# Patient Record
Sex: Female | Born: 1940 | Race: White | Hispanic: No | Marital: Married | Smoking: Never smoker
Health system: Southern US, Community
[De-identification: ages and names within clinical notes are randomized; demographics above are authoritative.]

## PROBLEM LIST (undated history)

## (undated) DIAGNOSIS — I10 Essential (primary) hypertension: Secondary | ICD-10-CM

## (undated) DIAGNOSIS — C801 Malignant (primary) neoplasm, unspecified: Secondary | ICD-10-CM

## (undated) DIAGNOSIS — L509 Urticaria, unspecified: Secondary | ICD-10-CM

## (undated) DIAGNOSIS — E039 Hypothyroidism, unspecified: Secondary | ICD-10-CM

## (undated) DIAGNOSIS — K5790 Diverticulosis of intestine, part unspecified, without perforation or abscess without bleeding: Secondary | ICD-10-CM

## (undated) DIAGNOSIS — E785 Hyperlipidemia, unspecified: Secondary | ICD-10-CM

## (undated) DIAGNOSIS — K589 Irritable bowel syndrome without diarrhea: Secondary | ICD-10-CM

## (undated) DIAGNOSIS — F419 Anxiety disorder, unspecified: Secondary | ICD-10-CM

## (undated) HISTORY — DX: Irritable bowel syndrome, unspecified: K58.9

## (undated) HISTORY — PX: TONSILLECTOMY: SUR1361

## (undated) HISTORY — DX: Malignant (primary) neoplasm, unspecified: C80.1

## (undated) HISTORY — PX: THYROIDECTOMY, PARTIAL: SHX18

## (undated) HISTORY — DX: Hyperlipidemia, unspecified: E78.5

## (undated) HISTORY — DX: Essential (primary) hypertension: I10

## (undated) HISTORY — DX: Anxiety disorder, unspecified: F41.9

## (undated) HISTORY — DX: Diverticulosis of intestine, part unspecified, without perforation or abscess without bleeding: K57.90

## (undated) HISTORY — PX: CHOLECYSTECTOMY: SHX55

## (undated) HISTORY — PX: APPENDECTOMY: SHX54

## (undated) HISTORY — PX: OTHER SURGICAL HISTORY: SHX169

## (undated) HISTORY — DX: Hypothyroidism, unspecified: E03.9

## (undated) HISTORY — DX: Urticaria, unspecified: L50.9

---

## 1998-01-13 ENCOUNTER — Other Ambulatory Visit: Admission: RE | Admit: 1998-01-13 | Discharge: 1998-01-13 | Payer: Self-pay | Admitting: Obstetrics and Gynecology

## 1998-06-10 ENCOUNTER — Other Ambulatory Visit: Admission: RE | Admit: 1998-06-10 | Discharge: 1998-06-10 | Payer: Self-pay | Admitting: Otolaryngology

## 1999-07-06 ENCOUNTER — Other Ambulatory Visit: Admission: RE | Admit: 1999-07-06 | Discharge: 1999-07-06 | Payer: Self-pay | Admitting: Obstetrics and Gynecology

## 1999-11-21 ENCOUNTER — Encounter: Admission: RE | Admit: 1999-11-21 | Discharge: 1999-11-21 | Payer: Self-pay | Admitting: *Deleted

## 1999-12-01 ENCOUNTER — Encounter (INDEPENDENT_AMBULATORY_CARE_PROVIDER_SITE_OTHER): Payer: Self-pay | Admitting: *Deleted

## 1999-12-01 ENCOUNTER — Ambulatory Visit (HOSPITAL_COMMUNITY): Admission: RE | Admit: 1999-12-01 | Discharge: 1999-12-01 | Payer: Self-pay | Admitting: *Deleted

## 2000-03-20 ENCOUNTER — Encounter: Payer: Self-pay | Admitting: Surgery

## 2000-03-20 ENCOUNTER — Encounter: Admission: RE | Admit: 2000-03-20 | Discharge: 2000-03-20 | Payer: Self-pay | Admitting: Surgery

## 2000-03-22 ENCOUNTER — Encounter: Payer: Self-pay | Admitting: Surgery

## 2000-03-26 ENCOUNTER — Observation Stay (HOSPITAL_COMMUNITY): Admission: RE | Admit: 2000-03-26 | Discharge: 2000-03-27 | Payer: Self-pay | Admitting: Surgery

## 2000-03-26 ENCOUNTER — Encounter (INDEPENDENT_AMBULATORY_CARE_PROVIDER_SITE_OTHER): Payer: Self-pay

## 2000-10-11 ENCOUNTER — Other Ambulatory Visit: Admission: RE | Admit: 2000-10-11 | Discharge: 2000-10-11 | Payer: Self-pay | Admitting: Obstetrics and Gynecology

## 2002-04-07 ENCOUNTER — Other Ambulatory Visit: Admission: RE | Admit: 2002-04-07 | Discharge: 2002-04-07 | Payer: Self-pay | Admitting: Obstetrics and Gynecology

## 2003-05-24 ENCOUNTER — Other Ambulatory Visit: Admission: RE | Admit: 2003-05-24 | Discharge: 2003-05-24 | Payer: Self-pay | Admitting: Radiology

## 2003-08-28 HISTORY — PX: OTHER SURGICAL HISTORY: SHX169

## 2003-11-05 ENCOUNTER — Other Ambulatory Visit: Admission: RE | Admit: 2003-11-05 | Discharge: 2003-11-05 | Payer: Self-pay | Admitting: Obstetrics and Gynecology

## 2004-03-14 ENCOUNTER — Encounter: Admission: RE | Admit: 2004-03-14 | Discharge: 2004-03-14 | Payer: Self-pay | Admitting: Surgery

## 2004-03-15 ENCOUNTER — Ambulatory Visit (HOSPITAL_BASED_OUTPATIENT_CLINIC_OR_DEPARTMENT_OTHER): Admission: RE | Admit: 2004-03-15 | Discharge: 2004-03-15 | Payer: Self-pay | Admitting: Surgery

## 2004-03-15 ENCOUNTER — Ambulatory Visit (HOSPITAL_COMMUNITY): Admission: RE | Admit: 2004-03-15 | Discharge: 2004-03-15 | Payer: Self-pay | Admitting: Surgery

## 2004-03-15 ENCOUNTER — Encounter (INDEPENDENT_AMBULATORY_CARE_PROVIDER_SITE_OTHER): Payer: Self-pay | Admitting: *Deleted

## 2004-04-04 ENCOUNTER — Ambulatory Visit: Admission: RE | Admit: 2004-04-04 | Discharge: 2004-06-08 | Payer: Self-pay | Admitting: Radiation Oncology

## 2004-07-10 ENCOUNTER — Encounter: Admission: RE | Admit: 2004-07-10 | Discharge: 2004-07-10 | Payer: Self-pay | Admitting: Surgery

## 2005-11-13 IMAGING — CR DG CHEST 2V
2 series · 2 of 2 positions shown · non-contrast
Comparison: none

CLINICAL DATA: Right breast carcinoma.  Preop respiratory exam.
 CHEST X-RAY
 Two views of the chest are compared to a chest x-ray of 10/01/95.  The lungs are clear.  The heart is within normal limits in size.  Surgical clips overlie the right upper outer breast. 
 IMPRESSION
 No active lung disease.

[view not recorded (1 of 2)]
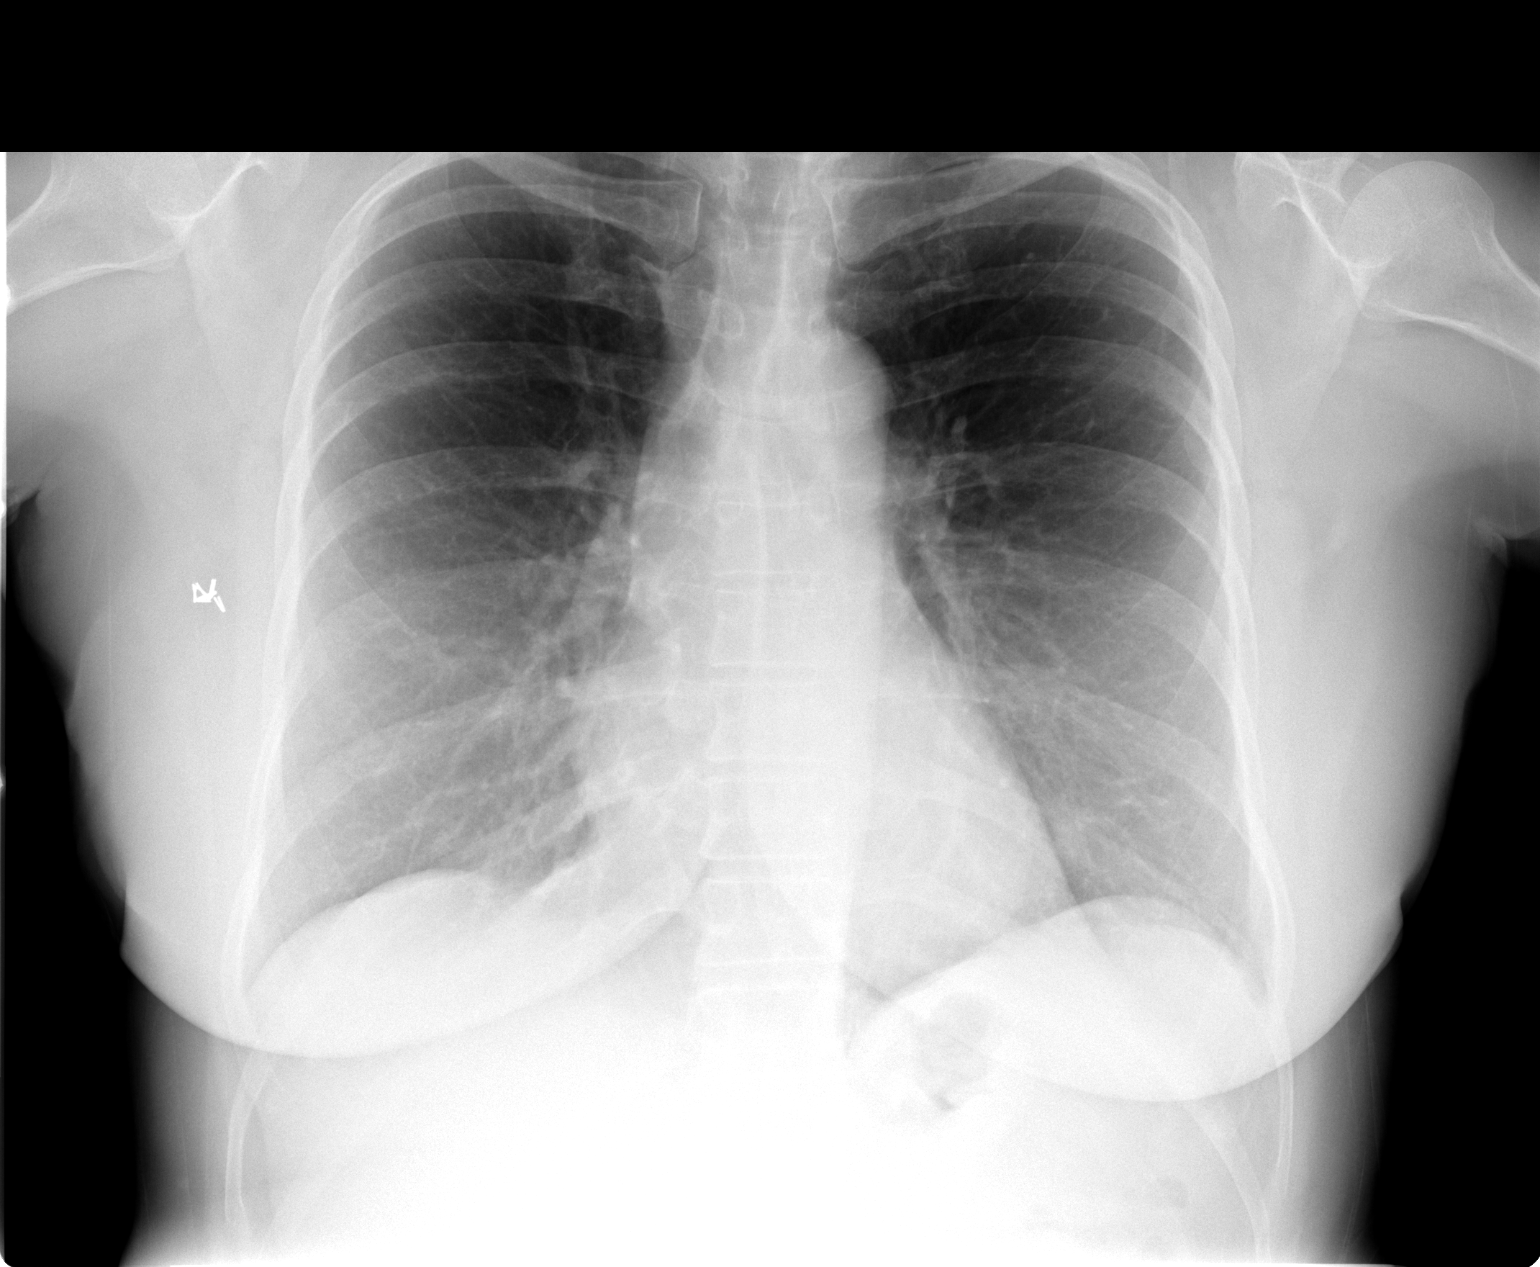

[view not recorded (2 of 2)]
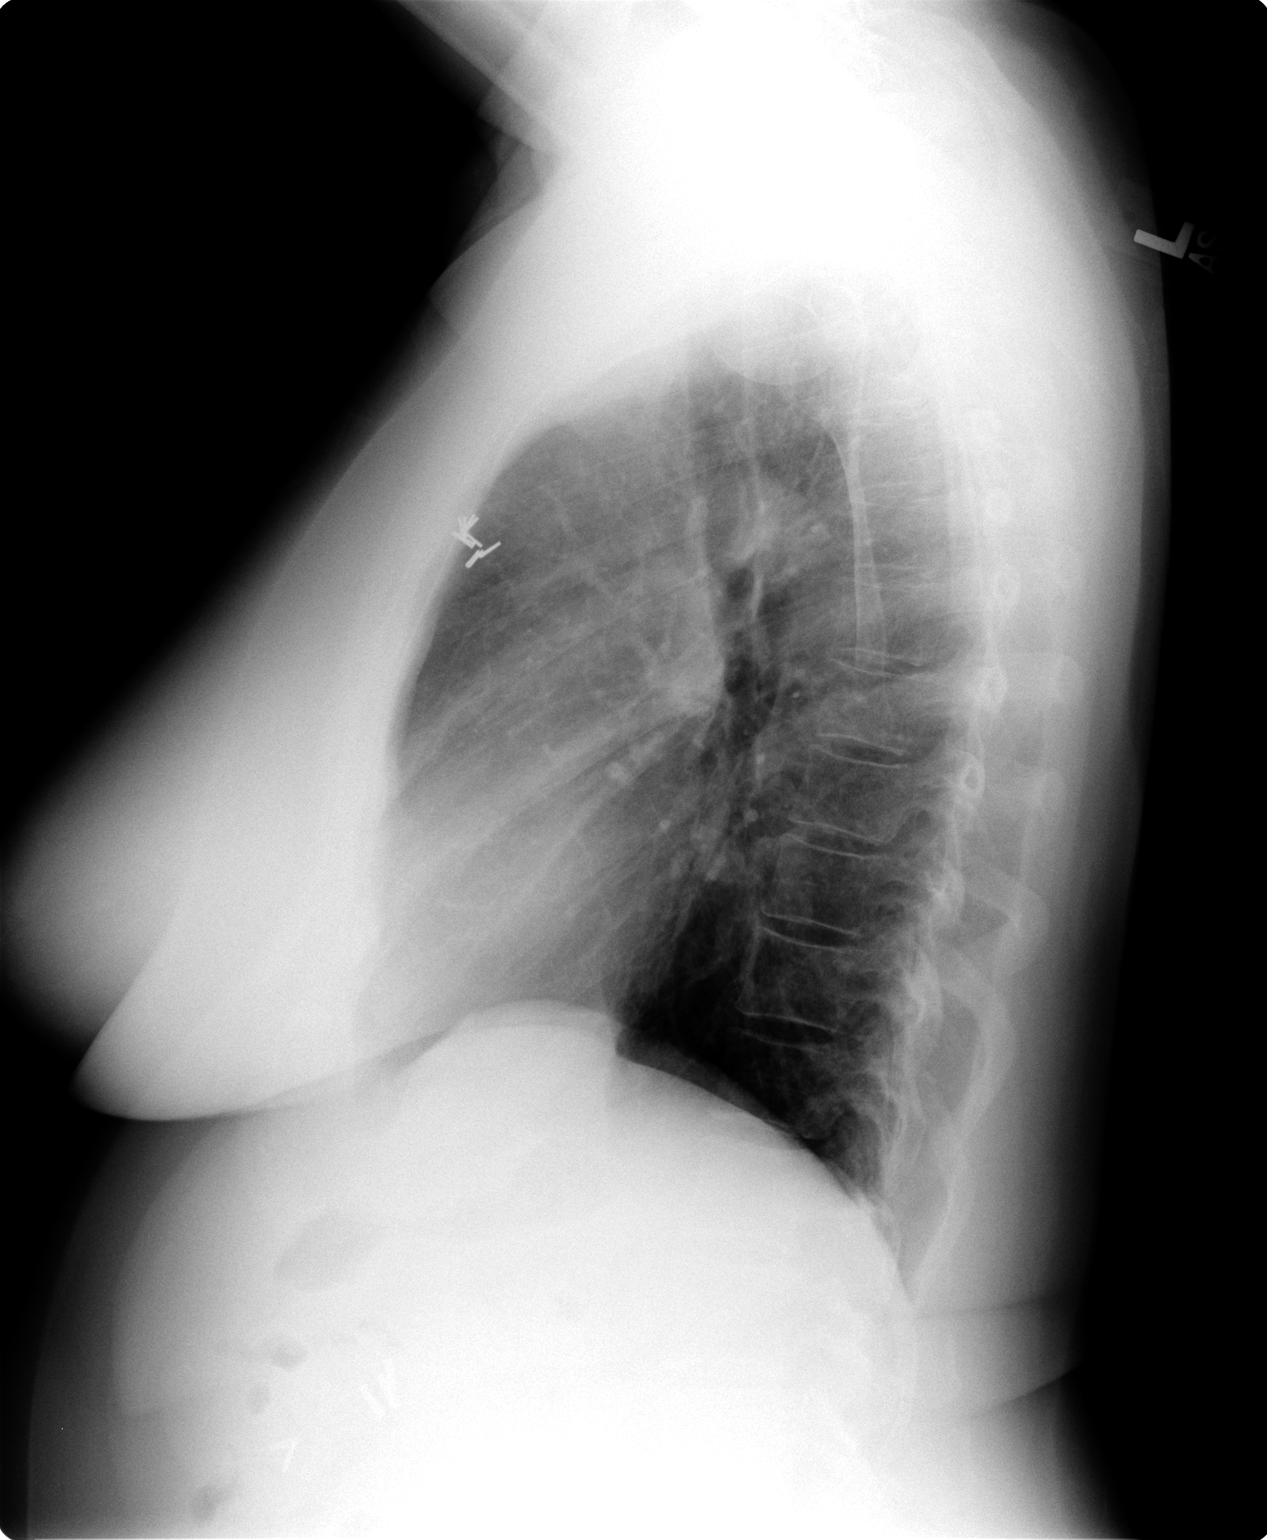

[2 of 2 positions shown; findings below may reference images not displayed]

## 2006-01-03 ENCOUNTER — Other Ambulatory Visit: Admission: RE | Admit: 2006-01-03 | Discharge: 2006-01-03 | Payer: Self-pay | Admitting: Obstetrics and Gynecology

## 2009-04-14 ENCOUNTER — Inpatient Hospital Stay (HOSPITAL_COMMUNITY): Admission: EM | Admit: 2009-04-14 | Discharge: 2009-04-15 | Payer: Self-pay | Admitting: Emergency Medicine

## 2009-04-14 ENCOUNTER — Ambulatory Visit: Payer: Self-pay | Admitting: Diagnostic Radiology

## 2009-04-14 ENCOUNTER — Encounter: Payer: Self-pay | Admitting: Emergency Medicine

## 2010-02-06 ENCOUNTER — Ambulatory Visit: Payer: Self-pay | Admitting: Emergency Medicine

## 2010-02-06 DIAGNOSIS — I10 Essential (primary) hypertension: Secondary | ICD-10-CM | POA: Insufficient documentation

## 2010-02-06 DIAGNOSIS — J45909 Unspecified asthma, uncomplicated: Secondary | ICD-10-CM | POA: Insufficient documentation

## 2010-02-06 DIAGNOSIS — S7010XA Contusion of unspecified thigh, initial encounter: Secondary | ICD-10-CM | POA: Insufficient documentation

## 2010-04-15 ENCOUNTER — Encounter: Payer: Self-pay | Admitting: Cardiology

## 2010-05-09 DIAGNOSIS — K589 Irritable bowel syndrome without diarrhea: Secondary | ICD-10-CM | POA: Insufficient documentation

## 2010-05-09 DIAGNOSIS — F411 Generalized anxiety disorder: Secondary | ICD-10-CM | POA: Insufficient documentation

## 2010-05-10 ENCOUNTER — Ambulatory Visit: Payer: Self-pay | Admitting: Cardiology

## 2010-05-10 ENCOUNTER — Encounter: Payer: Self-pay | Admitting: Cardiology

## 2010-05-10 DIAGNOSIS — R55 Syncope and collapse: Secondary | ICD-10-CM | POA: Insufficient documentation

## 2010-05-10 DIAGNOSIS — R072 Precordial pain: Secondary | ICD-10-CM | POA: Insufficient documentation

## 2010-05-15 ENCOUNTER — Telehealth: Payer: Self-pay | Admitting: Cardiology

## 2010-05-22 ENCOUNTER — Ambulatory Visit: Payer: Self-pay

## 2010-05-22 ENCOUNTER — Ambulatory Visit: Payer: Self-pay | Admitting: Cardiology

## 2010-05-22 ENCOUNTER — Telehealth: Payer: Self-pay | Admitting: Cardiology

## 2010-05-22 ENCOUNTER — Ambulatory Visit (HOSPITAL_COMMUNITY): Admission: RE | Admit: 2010-05-22 | Discharge: 2010-05-22 | Payer: Self-pay | Admitting: Cardiology

## 2010-05-24 ENCOUNTER — Telehealth: Payer: Self-pay | Admitting: Cardiology

## 2010-06-28 ENCOUNTER — Telehealth: Payer: Self-pay | Admitting: Cardiology

## 2010-06-28 DIAGNOSIS — R002 Palpitations: Secondary | ICD-10-CM | POA: Insufficient documentation

## 2010-07-03 ENCOUNTER — Telehealth (INDEPENDENT_AMBULATORY_CARE_PROVIDER_SITE_OTHER): Payer: Self-pay | Admitting: *Deleted

## 2010-07-05 ENCOUNTER — Ambulatory Visit: Payer: Self-pay | Admitting: Cardiology

## 2010-09-18 ENCOUNTER — Encounter
Admission: RE | Admit: 2010-09-18 | Discharge: 2010-09-18 | Payer: Self-pay | Source: Home / Self Care | Attending: Surgery | Admitting: Surgery

## 2010-09-26 NOTE — Progress Notes (Signed)
Summary: irregular heart beat question  Phone Note Call from Patient Call back at Home Phone [phone]   Caller: Patient Reason for Call: Talk to Nurse Summary of Call: pt would like to get a holter monitor. pt states her blood pressure monitor light of irregular heart beat keep coming up. pt would likt to talk to a nurse. Initial call taken by: Roe Coombs,  June 28, 2010 10:16 AM  Follow-up for Phone Call        spoke with pt, she has noticed several times when checking her bp the irregular heart beat light will come on. she reports at times feeling a fluttering in her chest or a dropped beat feeling. she reports a fast heart rate when climbing stairs but not everytime. she feels fine but this am when she woke there was a tight feeling across her chest, she checked her bp and the light came on for irregular heart beat. her medical md had told her to give [place] a call to find out about maybe wearing a monitor. will foward for dr Jens Som review Deliah Goody, RN  June 28, 2010 12:28 PM   Additional Follow-up for Phone Call Additional follow up Details #1::        cardionet Ferman Hamming, MD, [facility]  June 28, 2010 2:22 PM  pt aware, order placed Deliah Goody, RN  June 28, 2010 3:23 PM   New Problems: PALPITATIONS (ICD-785.1)   New Problems: PALPITATIONS (ICD-785.1)

## 2010-09-26 NOTE — Progress Notes (Signed)
Summary: explain pv appt 10.07.11  Phone Note Call from Patient Call back at Home Phone [phone]   Caller: Patient Reason for Call: Talk to Nurse Summary of Call: from office note please explain to pt why she seeing pv dr on 10.07.2011 Initial call taken by: Lorne Skeens,  May 15, 2010 4:22 PM  Follow-up for Phone Call        pt felt this appointment not necessary. assured pt that if tests reflect a condition of serious nature, that we would make every effort to get her in as soon as possible. Claris Gladden, RN, BSN 9/19 [phone]

## 2010-09-26 NOTE — Progress Notes (Signed)
Summary: rtn call to get test resutls  Phone Note Call from Patient Call back at Home Phone [phone]   Caller: Patient Reason for Call: Talk to Nurse, Talk to Doctor, Lab or Test Results Summary of Call: pt rtn your call to get test results Initial call taken by: Omer Jack,  May 24, 2010 11:14 AM  Follow-up for Phone Call        Phone Call Completed PT AWARE OF ECHO RESULTS. Follow-up by: Scherrie Bateman, LPN,  May 24, 2010 12:23 PM

## 2010-09-26 NOTE — Assessment & Plan Note (Signed)
Summary: [facility]   Visit Type:  Initial Consult Primary Ltanya Bayley:  bowen  CC:  Near-syncope episode.  History of Present Illness: 70 year old female for evaluation of syncope. Seen in the [facility] in August with near syncope. Cardiac markers were negative. Renal function was normal. Hemoglobin was 14.4. Head CT showed no acute disease. Chest x-ray was normal. Patient states that on the day of the event she was talking on the phone. She then stood suddenly and ran up the stairs to her daughter's room. She felt somewhat dizzy with mild heart palpitations. She lay down but then could not "clear her head". She states she was somewhat disoriented. This lasted for approximately 2-1/2 hours. She did not have frank syncope. She also notes some dyspnea on exertion but attributes this to asthma. There is no orthopnea, PND, pedal edema. She occasionally feels an ache in her chest when she is "anxious". This can also occur with extreme exertion. It is relieved with clonazepam. Because of the above we were asked to further evaluate.  Current Medications (verified): 1)  Allegra Allergy 180 Mg Tabs (Fexofenadine Hcl) .... Take 1 Tab By Mouth At Bedtime 2)  Enalapril Maleate 10 Mg Tabs (Enalapril Maleate) .... Take One Tablet By Mouth Twice A Day 3)  Levothyroxine Sodium 50 Mcg Tabs (Levothyroxine Sodium) .... Once Daily 4)  Proair Hfa 108 (90 Base) Mcg/act Aers (Albuterol Sulfate) .... As Needed 5)  Clonazepam 0.5 Mg Tabs (Clonazepam) .... Prn 6)  Estrace 0.1 Mg/gm Crea (Estradiol) .... Two Times A Day 7)  Miralax  Powd (Polyethylene Glycol 3350) .... Daily  Allergies: 1)  ! Pcn 2)  ! Codeine 3)  ! Levaquin 4)  ! * Biactin 5)  ! Compazine 6)  ! * Singulair 7)  ! * Zyrtec  Past History:  Past Medical History: HYPERTENSION  Hyperlipidemia Hypothyroid IBS  Diverticulitis ANXIETY ASTHMA Ductal carcinoma in situ of the right breast.   Past Surgical  History: Hysterectomy right lumpectomy 2005 partial thyroid Appendectomy Cholecystectomy. tonsillectomy  Family History: Reviewed history from 02/06/2010 and no changes required. breast CA- mother No premature CAD Father with AAA  Social History: Reviewed history from 02/06/2010 and no changes required. Married Never Smoked Alcohol use-no Drug use-no  Review of Systems       no fevers or chills, productive cough, hemoptysis, dysphasia, odynophagia, melena, hematochezia, dysuria, hematuria, rash, seizure activity, orthopnea, PND, pedal edema, claudication. Remaining systems are negative.   Vital Signs:  Patient profile:   70 year old female Height:      61 inches Weight:      133.50 pounds BMI:     25.32 Pulse rate:   96 / minute Pulse rhythm:   regular Resp:     18 per minute BP sitting:   162 / 106  (right arm) Cuff size:   regular  Vitals Entered By: Vikki Ports (May 10, 2010 3:07 PM)  Physical Exam  General:  Well developed/well nourished in NAD Skin warm/dry Patient not depressed; extremely anxious No peripheral clubbing Back-normal HEENT-normal/normal eyelids Neck supple/normal carotid upstroke bilaterally; no bruits; no JVD; no thyromegaly chest - CTA/ normal expansion CV - RRR/normal S1 and S2; no murmurs, rubs or gallops;  PMI nondisplaced Abdomen -NT/ND, no HSM, no mass, + bowel sounds, no bruit 2+ femoral pulses, no bruits Ext-no edema, chords, 2+ DP Neuro-grossly nonfocal     Impression & Recommendations:  Problem # 1:  CHEST PAIN, PRECORDIAL (ICD-786.51) Symptoms atypical. Probable anxiety component. Schedule  stress echocardiogram. Her updated medication list for this problem includes:    Enalapril Maleate 10 Mg Tabs (Enalapril maleate) .Marland Kitchen... Take one tablet by mouth twice a day  Orders: Stress Echo (Stress Echo)  Problem # 2:  SYNCOPE (ICD-780.2) Patient did not have frank syncope but did have a "dizzy spell". However  afterwards she could not "clear her head" for 2-1/2 hours. I doubt significant arrhythmia. The stress echocardiogram will help quantify her LV function. If normal we will not pursue further cardiac workup unless she has recurrent episodes in the future. Her updated medication list for this problem includes:    Enalapril Maleate 10 Mg Tabs (Enalapril maleate) .Marland Kitchen... Take one tablet by mouth twice a day  Problem # 3:  HYPERTENSION (ICD-401.9) Blood pressure elevated but patient extremely anxious in the office. She did bring records today and her systolic typically is 120 and diastolic typically 70. Continue present regimen. Her updated medication list for this problem includes:    Enalapril Maleate 10 Mg Tabs (Enalapril maleate) .Marland Kitchen... Take one tablet by mouth twice a day  Problem # 4:  ANXIETY (ICD-300.00)  Problem # 5:  ASTHMA (ICD-493.90)  Her updated medication list for this problem includes:    Proair Hfa 108 (90 Base) Mcg/act Aers (Albuterol sulfate) .Marland Kitchen... As needed  Patient Instructions: 1)  Your physician recommends that you schedule a follow-up appointment in: AS NEEDED PENDING TEST RESULTS 2)  Your physician has requested that you have a stress echocardiogram. For further information please visit [email or web address]  Please follow instruction sheet as given.

## 2010-09-26 NOTE — Progress Notes (Signed)
Summary: event monitor  Phone Note Outgoing Call Call back at [facility] Phone [phone]   Call placed by: Stanton Kidney, EMT-P,  July 03, 2010 11:55 AM Summary of Call: [facility] to schedule for event monitor. Stanton Kidney, EMT-P  July 03, 2010 11:55 AM  Pt called back and scheduled for 07/05/10 at 4pm. Stanton Kidney, EMT-P  July 03, 2010 2:35 PM      Appended Document: event monitor pt aware monitor shows sinus with occ PAC

## 2010-09-26 NOTE — Progress Notes (Signed)
Summary: discuss echo test today  Phone Note Call from Patient Call back at Home Phone [phone]   Caller: Patient Reason for Call: Talk to Nurse Summary of Call:  per pt calling want to discuss appt today  echo . pt has situation with her dtr last night only receive 5 hrs sleep.  Initial call taken by: Lorne Skeens,  May 22, 2010 11:36 AM  Follow-up for Phone Call        spoke with pt, she is having a stress echo today and has not had alot of sleep and is concerned about being able to do the testing with app. results. pt transferred to discuss with wanda dill to decide if rescheduling is the thing to do Deliah Goody, RN  May 22, 2010 12:32 PM\par

## 2010-09-26 NOTE — Assessment & Plan Note (Signed)
Summary: KNOT ON LEG?   Vital Signs:  Patient Profile:   70 Years Old Female CC:      knot to back of right leg X today Height:     61 inches Weight:      130 pounds O2 Sat:      98 % O2 treatment:    Room Air Temp:     97.14 degrees F oral Pulse rate:   100 / minute Resp:     14 per minute BP sitting:   155 / 93  (right arm) Cuff size:   regular  Pt. in pain?   yes    Location:   back right leg    Intensity:   3    Type:       sore  Vitals Entered By: Lajean Saver RN (February 06, 2010 2:47 PM)                   Updated Prior Medication List: ALLEGRA 60 MG TABS (FEXOFENADINE HCL) prn ENALAPRIL MALEATE 20 MG TABS (ENALAPRIL MALEATE) 1/2 tab once daily LEVOTHYROXINE SODIUM 50 MCG TABS (LEVOTHYROXINE SODIUM) once daily ALBUTEROL SULFATE (2.5 MG/3ML) 0.083% NEBU (ALBUTEROL SULFATE) prn CLONAZEPAM 0.5 MG TABS (CLONAZEPAM) prn  Current Allergies: ! PCN ! CODEINE ! LEVAQUINHistory of Present Illness History from: patient Chief Complaint: knot to back of right leg X today History of Present Illness: She was playing with her dog.  The dog had a wet rope in his mouth and swung it against the back of her leg.  She developed a bruise and swelling and she is concerned about a blood clot.  No fever, minimal pain that is getting better, swelling is improving.  REVIEW OF SYSTEMS Constitutional Symptoms      Denies fever, chills, night sweats, weight loss, weight gain, and fatigue.  Eyes       Denies change in vision, eye pain, eye discharge, glasses, contact lenses, and eye surgery. Ear/Nose/Throat/Mouth       Denies hearing loss/aids, change in hearing, ear pain, ear discharge, dizziness, frequent runny nose, frequent nose bleeds, sinus problems, sore throat, hoarseness, and tooth pain or bleeding.  Respiratory       Denies dry cough, productive cough, wheezing, shortness of breath, asthma, bronchitis, and emphysema/COPD.  Cardiovascular       Denies murmurs, chest pain, and  tires easily with exhertion.    Gastrointestinal       Denies stomach pain, nausea/vomiting, diarrhea, constipation, blood in bowel movements, and indigestion. Genitourniary       Denies painful urination, kidney stones, and loss of urinary control. Neurological       Denies paralysis, seizures, and fainting/blackouts. Musculoskeletal       Complains of swelling.      Denies muscle pain, joint pain, joint stiffness, decreased range of motion, redness, muscle weakness, and gout.      Comments: back rright leg Skin       Denies bruising, unusual mles/lumps or sores, and hair/skin or nail changes.  Psych       Denies mood changes, temper/anger issues, anxiety/stress, speech problems, depression, and sleep problems. Other Comments: patient was playing with her dog today and his toy hit the back of her leg causing a lump that has decreased over time   Past History:  Past Medical History: Asthma Hypertension  Past Surgical History: Hysterectomy right lumpectomy 2005 partial thyroid  Family History: breast CA- mother  Social History: Married Never Smoked Alcohol use-no Drug use-no  Smoking Status:  never Drug Use:  no Physical Exam General appearance: well developed, well nourished, no acute distress Chest/Lungs: no rales, wheezes, or rhonchi bilateral, breath sounds equal without effort Heart: regular rate and  rhythm, no murmur Skin: 2cm superficial bruise on posterior right distal thigh, no pain with squeezing leg, no swelling around bruise or distally, no warmth or signs of DVT MSE: oriented to time, place, and person Assessment New Problems: CONTUSION OF THIGH (ICD-924.00) HYPERTENSION (ICD-401.9) ASTHMA (ICD-493.90)   Plan New Orders: New Patient Level II [99202]  The patient and/or caregiver has been counseled thoroughly with regard to medications prescribed including dosage, schedule, interactions, rationale for use, and possible side effects and they verbalize  understanding.  Diagnoses and expected course of recovery discussed and will return if not improved as expected or if the condition worsens. Patient and/or caregiver verbalized understanding.   Patient Instructions: 1)  Reassurance given, ice leg as needed.  If developing increased pain or swelling, RTC or go to ER.  Activity as tolerated  Orders Added: 1)  New Patient Level II [16109]

## 2010-12-02 LAB — CBC
HCT: 43.4 % (ref 36.0–46.0)
Hemoglobin: 13.6 g/dL (ref 12.0–15.0)
Hemoglobin: 14.6 g/dL (ref 12.0–15.0)
MCHC: 34.3 g/dL (ref 30.0–36.0)
MCHC: 33.5 g/dL (ref 30.0–36.0)
MCV: 88.6 fL (ref 78.0–100.0)
RBC: 4.38 MIL/uL (ref 3.87–5.11)
RDW: 13.4 % (ref 11.5–15.5)

## 2010-12-02 LAB — BASIC METABOLIC PANEL
CO2: 24 mEq/L (ref 19–32)
CO2: 25 mEq/L (ref 19–32)
Calcium: 8.4 mg/dL (ref 8.4–10.5)
Calcium: 9.9 mg/dL (ref 8.4–10.5)
GFR calc Af Amer: >60 mL/min (ref >60)
Glucose, Bld: 143 mg/dL — ABNORMAL HIGH (ref 70–99)
Potassium: 3.8 mEq/L (ref 3.5–5.1)
Sodium: 139 mEq/L (ref 135–145)
Sodium: 142 mEq/L (ref 135–145)

## 2010-12-02 LAB — DIFFERENTIAL
Basophils Absolute: 0 10*3/uL (ref 0.0–0.1)
Basophils Relative: 1 % (ref 0–1)
Eosinophils Absolute: 0.1 10*3/uL (ref 0.0–0.7)
Eosinophils Relative: 1 % (ref 0–5)
Monocytes Absolute: 0.4 10*3/uL (ref 0.1–1.0)

## 2010-12-02 LAB — URINALYSIS, ROUTINE W REFLEX MICROSCOPIC
Bilirubin Urine: NEGATIVE
Ketones, ur: NEGATIVE mg/dL
Nitrite: NEGATIVE
Urobilinogen, UA: 1 mg/dL (ref 0.0–1.0)

## 2010-12-02 LAB — HEPATIC FUNCTION PANEL
AST: 25 U/L (ref 0–37)
Alkaline Phosphatase: 89 U/L (ref 39–117)
Bilirubin, Direct: 0 mg/dL (ref 0.0–0.3)
Total Bilirubin: 0.5 mg/dL (ref 0.3–1.2)

## 2010-12-02 LAB — LIPASE, BLOOD: Lipase: 126 U/L (ref 23–300)

## 2010-12-21 ENCOUNTER — Inpatient Hospital Stay (INDEPENDENT_AMBULATORY_CARE_PROVIDER_SITE_OTHER)
Admission: RE | Admit: 2010-12-21 | Discharge: 2010-12-21 | Disposition: A | Discharge status: Home / Self Care | Payer: Medicare Other | Source: Ambulatory Visit | Attending: Family Medicine | Admitting: Family Medicine

## 2010-12-21 ENCOUNTER — Encounter: Payer: Self-pay | Admitting: Family Medicine

## 2010-12-21 DIAGNOSIS — J069 Acute upper respiratory infection, unspecified: Secondary | ICD-10-CM

## 2010-12-21 DIAGNOSIS — J209 Acute bronchitis, unspecified: Secondary | ICD-10-CM

## 2011-01-09 NOTE — H&P (Signed)
NAMEMARJA, [name] NO.:  [id number]   MEDICAL RECORD NO.:  [id number]          PATIENT TYPE:  INP   LOCATION:  5124                         FACILITY:  [facility]   PHYSICIAN:  Juanetta Gosling, MDDATE OF BIRTH:  March 13, 1941   DATE OF ADMISSION:  04/14/2009  DATE OF DISCHARGE:                              HISTORY & PHYSICAL   CONSULTATION:  April Palumbo-Rasch, MD   PRIMARY CARE PHYSICIAN:  Talmadge Coventry, M.D.   CHIEF COMPLAINTS:  Abdominal pain.   HISTORY OF PRESENT ILLNESS:  This is a 70 year old female who was in her  usual state of health until about 10 o'clock this morning when she began  developing bilateral abdominal lower quadrant pain.  She has been eating  today and attempted to eat as well as taking some of her IBS  medication  to see if this helped, but it did not.  The pain worsened throughout the  day and was migrating to the right upper quadrant as well.  She went to  the [employer] at [facility] due to her attrition in her pain.  She was then diagnosed with possible small bowel obstruction and  transferred here.  On my evaluation today, she feels much better after  she received some pain medication.  She began having some eructation  which is also made her feel better as well.  She had 1 episode of nausea  and a small episode of emesis that she takes as related to the pain that  she had.  She had a bowel movement last night but has not had one since  then and is not passing any gas today should this pain started.  She has  a history of a colonoscopy over 10 years ago but none in the last 10  years.  She has no real changes in her bowel except that it may be  related to dietary, of late she has no significant complaints of any  bright red blood per rectum.   PAST MEDICAL HISTORY:  1. DCIS of the right breast.  2. Chronic hives.  3. Hypertension.  4. Asthma.  5. Anxiety.  6. Irritable bowel syndrome.   PAST SURGICAL  HISTORY:  1. A right breast lumpectomy with re-excision and instead with a MRSA      infection it sounds like.  2. A total abdominal hysterectomy.  3. Appendectomy.  4. Cholecystectomy.  5. Left thyroid lobectomy.  Dr.  Gerrit Friends has performed several of her      prior surgeries.   FAMILY HISTORY:  Noncontributory.   SOCIAL HISTORY:  She is a nonsmoker.  Does not drink alcohol.  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   DRUG ALLERGIES:  1. Biaxin.  2. Codeine.  3. Levaquin.  4. Penicillin.  5. Compazine.   MEDICATIONS:  1. Allegra 180 mg q.p.m.  2. Provera p.r.n.  3. Clonazepam  3/4th of a 1 mg tablet q.p.m.  4. Enalapril 10 mg daily.  5. Synthroid 50 mcg q.a.m.   REVIEW OF SYSTEMS:  Otherwise negative.  PHYSICAL EXAMINATION:  VITAL SIGNS:  Temperature 97.4, pulse is 86,  respirations 20, saturations 100%, blood pressure 146/80.  GENERAL:  She is an anxious female in no distress.  NECK:  Without adenopathy.  HEART:  Regular rate and rhythm.  LUNGS:  Clear bilaterally.  ABDOMEN:  Mildly distended.  Bowel sounds are present.  She is  nontender.  GENITOURINARY:  She has no inguinal hernias.  EXTREMITIES:  She has palpable pulses throughout, has no edema.   LABORATORY EVALUATION:  She has a lipase of 126.  Her liver function  tests are within normal limits.  Her urinalysis is negative for  infection.  Her sodium is 142, potassium 3.8, chloride 104, CO2 of 25,  BUN 15, creatinine 1, glucose 143.  White blood cell count 8.6,  hematocrit 43.4, platelets of 316.  A CT scan shows that she is status  post a cholecystectomy.  She does have some dilated small bowel loops  with some decompressed distal small bowel loops and some diverticular  disease.   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   PLAN OF ACTION:  Admission n.p.o. on IV fluids.  She is not currently  having any symptoms of nausea or vomiting, and her CT scan does not   appear that she has a large amount of volume in her stomach, so I think  we will hold off on an NG tube.  We discussed that if this worsens, she  does develop nausea or vomiting, we will plan on placement of an  nasogastric tube.  We discussed this is likely from adhesive disease and  then hopefully will resolve on its own without conservative treatment.  We did discuss possible operative therapy if not resolved.  So, she  understands the plan of action.  We also discussed that she should get a  colonoscopy at some point and then she can follow up at a later date for  that.  I understand this has been discussed with her several times, she  just has been reluctant to do that.  We will also notify Dr. Gerrit Friends of  her admission to the hospital as well.      Juanetta Gosling, MD  Electronically Signed     MCW/MEDQ  D:  04/14/2009  T:  04/15/2009  Job:  [id number]   cc:   Talmadge Coventry, M.D.  Deanna Artis, MD

## 2011-01-12 NOTE — Op Note (Signed)
[facility]  Patient:    Vanessa [name], Vanessa [name]                        MRN: [id number] Proc. Date: 03/26/00 Adm. Date:  [id number] Disc. Date: [id number] Attending:  Bonnetta Barry CC:         Talmadge Coventry, M.D.             Fritzi Mandes, M.D.                           Operative Report  PREOPERATIVE DIAGNOSIS:  Left thyroid nodule, follicular neoplasm.  POSTOPERATIVE DIAGNOSIS:  Left thyroid nodule, follicular neoplasm.  OPERATION PERFORMED:  Left thyroid lobectomy.  SURGEON:  Velora Heckler, M.D.  ASSISTANT:  Zigmund Daniel, M.D.  ANESTHESIA:  General.  ESTIMATED BLOOD LOSS:  Minimal.  PREPARATION:  Betadine.  COMPLICATIONS:  None.  BRIEF HISTORY:  The patient is a 70 year old white female who had an incidental finding of a thyroid nodule on ultrasound.  The patient was seen by Dr. Talmadge Coventry and referred to Dr. Coralee North.  Fine needle aspiration was obtained.  This showed a follicular lesion with microfollicular pattern.  Subsequent ultrasound has showed an increase in size of the nodule. The patient comes to surgery for resection.  DESCRIPTION OF PROCEDURE:  The procedure was done in OR #2 at [facility].  The patient is brought to the operating room and placed in the supine position on the operating room table.  Following administration of general anesthesia, the patient is positioned and then prepped and draped in the usual strict aseptic fashion.  After ascertaining an adequate level of anesthesia had been obtained, a Kocher incision is made with a #10 blade. Dissection is carried down through the platysma.  Hemostasis is obtained with the electrocautery.  Skin flaps are developed cephalad and caudad from the thyroid notch to the sternal notch.  A Mahorner self-retaining retractor was placed for exposure.  The strap muscles were incised in the midline. Dissection was begun on the left side.  The strap  muscles are reflected laterally.  The middle thyroid vein is divided between small liga clips.  The gland is rolled anteriorly.  There is an approximately 1 to 1.5 cm posterior located nodule.  It is firm, discrete and does not appear to invade surrounding tissues.  Branches of the inferior thyroid artery are divided between small liga clips.  The recurrent laryngeal nerve is identified and preserved.  The superior pole vessels are divided between medium liga clips. The parathyroid tissue is identified and preserved.  The gland is rolled anteriorly.  A small nodule suffered from the thyroid gland is excised and submitted for frozen section.  This shows tissue consistent with thyroid that appears to be follicular with no evidence of malignancy.  The ligament of Allyson Sabal is transected and the gland is rolled anteriorly.  The isthmus is divided between hemostats.  It is suture ligated with 3-0 Vicryl suture ligature.  The entire left lobe is submitted to pathology where Dr. Guilford Shi reviewed this for frozen section.  It does have the appearance of a follicular neoplasm again with no sign of malignancy.  The right lobe is explored.  It is soft and small without nodularity.  Good hemostasis is obtained.  The strap muscles are reapproximated with interrupted 3-0 Vicryl sutures.  The platysma is reapproximated with interrupted 3-0 Vicryl sutures.  The skin edges are reapproximated with widely spaced stainless steel staples, interspace Benzoin and Steri-Strips.  Sterile gauze dressings are applied.  The patient is awakened from anesthesia and brought to the recovery room in stable condition. The patient tolerated the procedure well. DD:  03/26/00 TD:  03/27/00 Job: [id number]

## 2011-01-12 NOTE — Op Note (Signed)
Vanessa [name], PEMBER                           ACCOUNT NO.:  [id number]   MEDICAL RECORD NO.:  [id number]                   PATIENT TYPE:  AMB   LOCATION:  DSC                                  FACILITY:  [facility]   PHYSICIAN:  Velora Heckler, M.D.                DATE OF BIRTH:  08-Nov-1940   DATE OF PROCEDURE:  03/15/2004  DATE OF DISCHARGE:                                 OPERATIVE REPORT   REFERRING PHYSICIAN:  Margaret L. Yolanda Bonine, M.D.   PREOPERATIVE DIAGNOSIS:  Ductal carcinoma in situ right breast.   POSTOPERATIVE DIAGNOSIS:  Ductal carcinoma in situ right breast.   PROCEDURE:  Reexcision right partial mastectomy.   SURGEON:  Velora Heckler, M.D.   ANESTHESIA:  General anesthesia.   ESTIMATED BLOOD LOSS:  Minimal.   PREPARATION:  Betadine.   COMPLICATIONS:  None.   INDICATIONS FOR PROCEDURE:  The patient is a 70 year old white female who  had undergone needle localized right partial mastectomy for DCIS.  Final  surgical margins were positive for DCIS at the margin.  The patient now  returns for reexcision.   DESCRIPTION OF PROCEDURE:  The procedure is done in OR #5 at the [facility].  The patient is brought to the  operating room, placed in supine position on the operating room table.  Following administration of general anesthesia, the patient is prepped and  draped in the usual strict aseptic fashion.  After ascertaining that an  adequate level of anesthesia had been obtained, an elliptical incision is  made with a #15 blade around the previous incision.  Dissection is carried  into the subcutaneous tissues and scar tissue.  Dissection is carried down  to the chest wall.  Pectoralis major muscle is identified.  The previous  biopsy cavity is entered.  There is a moderate seroma.  This is evacuated.  Margins of the previous cavity are then excised using the electrocautery for  hemostasis.  Specimen is oriented using clips  showing medial, caudad,  cranial and lateral margins as well as the deep margin.  This was reviewed  by Dr. Kieth Brightly on touch preps.  There appear to be a few atypical cells  at the caudad margin but no sign of DCIS.  A final rim of excised tissue is  then taken from the medial and caudad portions of the biopsy site.  These  are marked with sutures and submitted as the final surgical margins. Good  hemostasis is obtained.  Margins of dissection are marked with small  Ligaclips.  Subcutaneous tissues are closed with interrupted  3-0 Vicryl sutures.  Skin is closed with a running 4-0 Vicryl subcuticular  suture.  Wound is washed and dried and Steri-Strips are applied.  Sterile  dressings are applied.  The patient is awakened from anesthesia and brought  to the recovery room in stable condition.  The patient tolerated the  procedure well.                                               Velora Heckler, M.D.    TMG/MEDQ  D:  03/15/2004  T:  03/15/2004  Job:  [id number]   cc:   Jeralyn Ruths, M.D.   Talmadge Coventry, M.D.  [address]  [place]  [place] [address]  Fax: [phone]

## 2011-04-10 ENCOUNTER — Encounter: Payer: Self-pay | Admitting: Cardiology

## 2011-07-30 NOTE — Progress Notes (Signed)
Summary: ASTHMA, COUGH/TJ (room 4)   Vital Signs:  Patient Profile:   70 Years Old Female CC:      Respiratory problems; temp at home with cough and tightness Height:     61 inches Weight:      136.25 pounds O2 Sat:      98 % O2 treatment:    Room Air Temp:     98 degrees F oral Pulse rate:   93 / minute Resp:     16 per minute BP sitting:   151 / 89  (left arm) Cuff size:   regular  Vitals Entered By: Lavell Islam RN (December 21, 2010 12:48 PM)                  Prior Medication List:  ALLEGRA ALLERGY 180 MG TABS (FEXOFENADINE HCL) Take 1 tab by mouth at bedtime ENALAPRIL MALEATE 10 MG TABS (ENALAPRIL MALEATE) Take one tablet by mouth twice a day LEVOTHYROXINE SODIUM 50 MCG TABS (LEVOTHYROXINE SODIUM) once daily PROAIR HFA 108 (90 BASE) MCG/ACT AERS (ALBUTEROL SULFATE) as needed CLONAZEPAM 0.5 MG TABS (CLONAZEPAM) prn ESTRACE 0.1 MG/GM CREA (ESTRADIOL) two times a day MIRALAX  POWD (POLYETHYLENE GLYCOL 3350) daily   Current Allergies (reviewed today): ! PCN ! CODEINE ! LEVAQUIN ! * BIACTIN ! COMPAZINE ! * SINGULAIR ! * ZYRTECHistory of Present Illness Chief Complaint: Respiratory problems; temp at home with cough and tightness History of Present Illness: Patient reports having shorness ogf breath over the last month. Thought things were getting better and then yesterday condition was exacerbated. Hx of asthma and recurrent flare up w/bronchitis. he is on cipro for on going UTI and has been on steroids before but is worried about steroid and cipro interactions.  Current Problems: UPPER RESPIRATORY INFECTION, ACUTE, WITH BRONCHITIS (ICD-465.9) BRONCHITIS, ACUTE WITH BRONCHOSPASM (ICD-466.0) PALPITATIONS (ICD-785.1) SYNCOPE (ICD-780.2) CHEST PAIN, PRECORDIAL (ICD-786.51) HYPERTENSION (ICD-401.9) IBS (ICD-564.1) ANXIETY (ICD-300.00) CONTUSION OF THIGH (ICD-924.00) ASTHMA (ICD-493.90)   Current Meds ALLEGRA ALLERGY 180 MG TABS (FEXOFENADINE HCL) Take 1 tab by  mouth at bedtime ENALAPRIL MALEATE 10 MG TABS (ENALAPRIL MALEATE) Take one tablet by mouth twice a day LEVOTHYROXINE SODIUM 50 MCG TABS (LEVOTHYROXINE SODIUM) once daily PROAIR HFA 108 (90 BASE) MCG/ACT AERS (ALBUTEROL SULFATE) as needed CLONAZEPAM 0.5 MG TABS (CLONAZEPAM) prn ESTRACE 0.1 MG/GM CREA (ESTRADIOL) two times a day MIRALAX  POWD (POLYETHYLENE GLYCOL 3350) daily CIPROFLOXACIN HCL 500 MG TABS (CIPROFLOXACIN HCL) two times a day (currently) PROAIR HFA 108 (90 BASE) MCG/ACT AERS (ALBUTEROL SULFATE)  ADVAIR DISKUS 250-50 MCG/DOSE MISC (FLUTICASONE-SALMETEROL) 1 puff 2 times daily ZITHROMAX Z-PAK 250 MG  TABS (AZITHROMYCIN) Use as directed  REVIEW OF SYSTEMS Constitutional Symptoms       Complains of fever and chills.     Denies night sweats, weight loss, weight gain, and fatigue.  Eyes       Denies change in vision, eye pain, eye discharge, glasses, contact lenses, and eye surgery. Ear/Nose/Throat/Mouth       Complains of frequent runny nose, sinus problems, sore throat, and hoarseness.      Denies hearing loss/aids, change in hearing, ear pain, ear discharge, dizziness, frequent nose bleeds, and tooth pain or bleeding.  Respiratory       Complains of dry cough, wheezing, and asthma.      Denies productive cough, shortness of breath, bronchitis, and emphysema/COPD.      Comments: chronic asthma; cough x 1 month Cardiovascular       Denies murmurs, chest pain, and  tires easily with exhertion.      Comments: forgot medicine this a.m.   Gastrointestinal       Denies stomach pain, nausea/vomiting, diarrhea, constipation, blood in bowel movements, and indigestion. Genitourniary       Denies painful urination, kidney stones, and loss of urinary control.      Comments: current UTI on Rx for 30 days Neurological       Denies paralysis, seizures, and fainting/blackouts. Musculoskeletal       Denies muscle pain, joint pain, joint stiffness, decreased range of motion, redness, swelling,  muscle weakness, and gout.  Skin       Denies bruising, unusual mles/lumps or sores, and hair/skin or nail changes.      Comments: chronic hives from heat/pressure Psych       Denies mood changes, temper/anger issues, anxiety/stress, speech problems, depression, and sleep problems. Other Comments: Cough x 1 month in addition to chronic asthma   Past History:  Family History: Last updated: 05/10/2010 breast CA- mother No premature CAD Father with AAA  Social History: Last updated: 02/06/2010 Married Never Smoked Alcohol use-no Drug use-no  Risk Factors: Smoking Status: never (02/06/2010)  Past Medical History: HYPERTENSION  Hyperlipidemia Hypothyroid IBS  Diverticulitis ANXIETY ASTHMA Ductal carcinoma in situ of the right breast Chronic Hives  Past Surgical History: Reviewed history from 05/10/2010 and no changes required. Hysterectomy right lumpectomy 2005 partial thyroid Appendectomy Cholecystectomy. tonsillectomy  Family History: Reviewed history from 05/10/2010 and no changes required. breast CA- mother No premature CAD Father with AAA  Social History: Reviewed history from 02/06/2010 and no changes required. Married Never Smoked Alcohol use-no Drug use-no  Problems Prior to Update: 1)  Palpitations  (ICD-785.1) 2)  Syncope  (ICD-780.2) 3)  Chest Pain, Precordial  (ICD-786.51) 4)  Hypertension  (ICD-401.9) 5)  Ibs  (ICD-564.1) 6)  Anxiety  (ICD-300.00) 7)  Contusion of Thigh  (ICD-924.00) 8)  Asthma  (ICD-493.90)  Medications Prior to Update: 1)  Allegra Allergy 180 Mg Tabs (Fexofenadine Hcl) .... Take 1 Tab By Mouth At Bedtime 2)  Enalapril Maleate 10 Mg Tabs (Enalapril Maleate) .... Take One Tablet By Mouth Twice A Day 3)  Levothyroxine Sodium 50 Mcg Tabs (Levothyroxine Sodium) .... Once Daily 4)  Proair Hfa 108 (90 Base) Mcg/act Aers (Albuterol Sulfate) .... As Needed 5)  Clonazepam 0.5 Mg Tabs (Clonazepam) .... Prn 6)  Estrace 0.1  Mg/gm Crea (Estradiol) .... Two Times A Day 7)  Miralax  Powd (Polyethylene Glycol 3350) .... Daily  Current Medications (verified): 1)  Allegra Allergy 180 Mg Tabs (Fexofenadine Hcl) .... Take 1 Tab By Mouth At Bedtime 2)  Enalapril Maleate 10 Mg Tabs (Enalapril Maleate) .... Take One Tablet By Mouth Twice A Day 3)  Levothyroxine Sodium 50 Mcg Tabs (Levothyroxine Sodium) .... Once Daily 4)  Proair Hfa 108 (90 Base) Mcg/act Aers (Albuterol Sulfate) .... As Needed 5)  Clonazepam 0.5 Mg Tabs (Clonazepam) .... Prn 6)  Estrace 0.1 Mg/gm Crea (Estradiol) .... Two Times A Day 7)  Miralax  Powd (Polyethylene Glycol 3350) .... Daily 8)  Ciprofloxacin Hcl 500 Mg Tabs (Ciprofloxacin Hcl) .... Two Times A Day (Currently)  Allergies: 1)  ! Pcn 2)  ! Codeine 3)  ! Levaquin 4)  ! * Biactin 5)  ! Compazine 6)  ! * Singulair 7)  ! * Zyrtec  Physical Exam General appearance: well developed, well nourished, no acute distress Head: normocephalic, atraumatic Oral/Pharynx: tongue normal, posterior pharynx without erythema or exudate Neck: neck supple,  trachea midline, no masses Chest/Lungs: no rales, wheezes, or rhonchi bilateral, breath sounds equal but easily SOB and movement poor Heart: regular rate and  rhythm, no murmur Neurological: grossly intact and non-focal Skin: no obvious rashes or lesions MSE: oriented to time, place, and person Assessment New Problems: UPPER RESPIRATORY INFECTION, ACUTE, WITH BRONCHITIS (ICD-465.9) BRONCHITIS, ACUTE WITH BRONCHOSPASM (ICD-466.0)  bronchitis  Patient Education: Patient and/or caregiver instructed in the following: rest, fluids.  Plan New Medications/Changes: ZITHROMAX Z-PAK 250 MG  TABS (AZITHROMYCIN) Use as directed  #1 x 0, 12/21/2010, Hassan Rowan MD ADVAIR DISKUS 250-50 MCG/DOSE MISC (FLUTICASONE-SALMETEROL) 1 puff 2 times daily  #1 x 0, 12/21/2010, Hassan Rowan MD ZITHROMAX Z-PAK 250 MG  TABS (AZITHROMYCIN) Use as directed  #1 x 0,  12/21/2010, Hassan Rowan MD ADVAIR DISKUS 250-50 MCG/DOSE MISC (FLUTICASONE-SALMETEROL) 1 puff 2 times daily  #1 x 0, 12/21/2010, Hassan Rowan MD  New Orders: New Patient Level III [phone] Nebulizer Tx [phone] Solumedrol up to 125mg  [J2930] Admin of Therapeutic Inj  intramuscular or subcutaneous [96372] Albuterol Sulfate Sol 1mg  unit dose [M5784] Ipratropium inhalation sol. unit dose [O9629] Planning Comments:   may need CXR if not improving  Follow Up: Follow up in 2-3 days if no improvement, Follow up on an as needed basis, Follow up with Primary Physician  The patient and/or caregiver has been counseled thoroughly with regard to medications prescribed including dosage, schedule, interactions, rationale for use, and possible side effects and they verbalize understanding.  Diagnoses and expected course of recovery discussed and will return if not improved as expected or if the condition worsens. Patient and/or caregiver verbalized understanding.  Prescriptions: ZITHROMAX Z-PAK 250 MG  TABS (AZITHROMYCIN) Use as directed  #1 x 0   Entered and Authorized by:   Hassan Rowan MD   Signed by:   Hassan Rowan MD on 12/21/2010   Method used:   Printed then faxed to ...       [facility] [phone]* (retail)       [address] [place], [place]  [address]       Ph: [phone]       Fax: [phone]   RxID:   [id number] ADVAIR DISKUS 250-50 MCG/DOSE MISC (FLUTICASONE-SALMETEROL) 1 puff 2 times daily  #1 x 0   Entered and Authorized by:   Hassan Rowan MD   Signed by:   Hassan Rowan MD on 12/21/2010   Method used:   Printed then faxed to ...       [facility] [phone]* (retail)       [address] [place], [place]  [address]       Ph: [phone]       Fax: [phone]   RxID:   [id number] ZITHROMAX Z-PAK 250 MG  TABS (AZITHROMYCIN) Use as directed  #1 x 0   Entered and Authorized by:   Hassan Rowan MD   Signed by:   Hassan Rowan MD on 12/21/2010   Method used:    Printed then faxed to ...       [facility] [phone]* (retail)       [address] [place], [place]  [address]       Ph: [phone]       Fax: [phone]   RxID:   [id number] ADVAIR DISKUS 250-50 MCG/DOSE MISC (FLUTICASONE-SALMETEROL) 1 puff 2 times daily  #1  x 0   Entered and Authorized by:   Hassan Rowan MD   Signed by:   Hassan Rowan MD on 12/21/2010   Method used:   Printed then faxed to ...       [facility] [phone]* (retail)       [address] [place], [place]  [address]       Ph: [phone]       Fax: [phone]   RxID:   [id number]   Patient Instructions: 1)  Please schedule a follow-up appointment as needed. 2)  Please schedule an appointment with your primary doctor in :7-14 3)  Take your antibiotic as prescribed until ALL of it is gone, but stop if you develop a rash or swelling and contact our office as soon as possible. 4)  Acute bronchitis symptoms for less than 10 days are not helped by antibiotics. take over the counter cough medications. call if no improvment in  5-7 days, sooner if increasing cough, fever, or new symptoms( shortness of breath, chest pain). 5)  It is important to use your inhaler properly. Take slow deep breaths and hold them. Rinse your mouth after using.   Medication Administration  Injection # 1:    Medication: Solumedrol up to 125mg     Diagnosis: UPPER RESPIRATORY INFECTION, ACUTE, WITH BRONCHITIS (ICD-465.9)    Route: IM    Site: LUOQ gluteus    Exp Date: 08/27/2013    Lot #: [id number]    Mfr: Pfizer    Comments: 125 mgs given    Patient tolerated injection without complications    Given by: Emilio Math (December 21, 2010 1:44 PM)  Medication # 1:    Medication: Albuterol Sulfate Sol 1mg  unit dose    Diagnosis: BRONCHITIS, ACUTE WITH BRONCHOSPASM (ICD-466.0)    Dose: 3.0 mgs    Route: inhaled    Exp Date: 12/26/2011    Lot #: [id number]    Mfr: Mylan    Comments: Duo Neb    Patient tolerated  medication without complications    Given by: Emilio Math (December 21, 2010 1:45 PM)  Medication # 2:    Medication: Ipratropium inhalation sol. unit dose    Diagnosis: BRONCHITIS, ACUTE WITH BRONCHOSPASM (ICD-466.0)    Dose: 0.5    Route: inhaled    Exp Date: 12/26/2011    Lot #: [id number]    Mfr: Mylan    Comments: Duo Neb    Patient tolerated medication without complications    Given by: Emilio Math (December 21, 2010 1:47 PM)  Orders Added: 1)  New Patient Level III [99203] 2)  Nebulizer Tx [94640] 3)  Solumedrol up to 125mg  [J2930] 4)  Admin of Therapeutic Inj  intramuscular or subcutaneous [96372] 5)  Albuterol Sulfate Sol 1mg  unit dose [J7613] 6)  Ipratropium inhalation sol. unit dose [Y4034]     Medication Administration  Injection # 1:    Medication: Solumedrol up to 125mg     Diagnosis: UPPER RESPIRATORY INFECTION, ACUTE, WITH BRONCHITIS (ICD-465.9)    Route: IM    Site: LUOQ gluteus    Exp Date: 08/27/2013    Lot #: [id number]    Mfr: Pfizer    Comments: 125 mgs given    Patient tolerated injection without complications    Given by: Emilio Math (December 21, 2010 1:44 PM)  Medication # 1:    Medication: Albuterol Sulfate Sol 1mg  unit dose    Diagnosis: BRONCHITIS, ACUTE WITH BRONCHOSPASM (ICD-466.0)  Dose: 3.0 mgs    Route: inhaled    Exp Date: 12/26/2011    Lot #: [id number]    Mfr: Mylan    Comments: Duo Neb    Patient tolerated medication without complications    Given by: Emilio Math (December 21, 2010 1:45 PM)  Medication # 2:    Medication: Ipratropium inhalation sol. unit dose    Diagnosis: BRONCHITIS, ACUTE WITH BRONCHOSPASM (ICD-466.0)    Dose: 0.5    Route: inhaled    Exp Date: 12/26/2011    Lot #: [id number]    Mfr: Mylan    Comments: Duo Neb    Patient tolerated medication without complications    Given by: Emilio Math (December 21, 2010 1:47 PM)  Orders Added: 1)  New Patient Level III [99203] 2)  Nebulizer Tx [94640] 3)  Solumedrol up to 125mg   [J2930] 4)  Admin of Therapeutic Inj  intramuscular or subcutaneous [96372] 5)  Albuterol Sulfate Sol 1mg  unit dose [J7613] 6)  Ipratropium inhalation sol. unit dose [A5409]

## 2012-01-28 ENCOUNTER — Emergency Department: Admit: 2012-01-28 | Discharge: 2012-01-28 | Disposition: A | Discharge status: Home / Self Care | Payer: Medicare Other

## 2012-01-28 ENCOUNTER — Emergency Department (INDEPENDENT_AMBULATORY_CARE_PROVIDER_SITE_OTHER)
Admission: EM | Admit: 2012-01-28 | Discharge: 2012-01-28 | Disposition: A | Discharge status: Home / Self Care | Payer: Medicare Other | Source: Home / Self Care | Attending: Family Medicine | Admitting: Family Medicine

## 2012-01-28 DIAGNOSIS — S92309A Fracture of unspecified metatarsal bone(s), unspecified foot, initial encounter for closed fracture: Secondary | ICD-10-CM

## 2012-01-28 DIAGNOSIS — S92301A Fracture of unspecified metatarsal bone(s), right foot, initial encounter for closed fracture: Secondary | ICD-10-CM

## 2012-01-28 NOTE — ED Provider Notes (Signed)
History     CSN: [id number]  Arrival date & time 01/28/12  1642   First MD Initiated Contact with Patient 01/28/12 1720      Chief Complaint  Patient presents with  . Fall      HPI Comments: Patient states that she tripped and fell yesterday, twisting her right foot.  She has had subsequent pain and swelling in the lateral aspect of her right foot.  Patient is a 71 y.o. female presenting with lower extremity pain. The history is provided by the patient.  Foot Pain This is a new problem. The current episode started 3 to 5 hours ago. The problem occurs constantly. The problem has not changed since onset.Associated symptoms comments: none. The symptoms are aggravated by walking and standing. The symptoms are relieved by nothing. She has tried nothing for the symptoms.    Past Medical History  Diagnosis Date  . HTN (hypertension)   . HLD (hyperlipidemia)   . IBS (irritable bowel syndrome)   . Hypothyroid   . Anxiety   . Diverticulosis   . Asthma   . Ductal carcinoma     in situ of right breast  . Hives     chronic    Past Surgical History  Procedure Date  . Hysterectomy (other)   . Right luimpectomy 2005  . Thyroidectomy, partial   . Appendectomy   . Cholecystectomy   . Tonsillectomy     Family History  Problem Relation Age of Onset  . Breast cancer Mother   . Cancer Mother   . Hypertension Mother   . Diabetes Father     History  Substance Use Topics  . Smoking status: Never Smoker   . Smokeless tobacco: Not on file  . Alcohol Use: No    OB History    Grav Para Term Preterm Abortions TAB SAB Ect Mult Living                  Review of Systems  All other systems reviewed and are negative.    Allergies  Cetirizine hcl; Codeine; Levofloxacin; Montelukast sodium; Penicillins; and Prochlorperazine edisylate  Home Medications   Current Outpatient Rx  Name Route Sig Dispense Refill  . ALBUTEROL SULFATE HFA 108 (90 BASE) MCG/ACT IN AERS Inhalation  Inhale 2 puffs into the lungs as needed.      Marland Kitchen CIPROFLOXACIN HCL 500 MG PO TABS Oral Take 500 mg by mouth 2 (two) times daily.      . ENALAPRIL MALEATE 10 MG PO TABS Oral Take 10 mg by mouth 2 (two) times daily.      Marland Kitchen FEXOFENADINE HCL 180 MG PO TABS Oral Take 180 mg by mouth daily.      Marland Kitchen FLUTICASONE-SALMETEROL 250-50 MCG/DOSE IN AEPB Inhalation Inhale 1 puff into the lungs 2 (two) times daily.      Marland Kitchen LEVOTHYROXINE SODIUM 50 MCG PO CAPS Oral Take by mouth daily.      . NON FORMULARY  Estrace .1 mg/gm crea: two times a day     . POLYETHYLENE GLYCOL 3350 PO POWD Oral Take 17 g by mouth daily.        BP 144/100  Pulse 105  Temp(Src) 98.6 F (37 C) (Oral)  Resp 20  Ht 5' 3.25" (1.607 m)  Wt 135 lb (61.236 kg)  BMI 23.73 kg/m2  SpO2 99%  Physical Exam  Nursing note and vitals reviewed. Constitutional: She is oriented to person, place, and time. She appears well-developed and well-nourished. No  distress.  Eyes: Conjunctivae are normal. Pupils are equal, round, and reactive to light.  Musculoskeletal: She exhibits tenderness.       Feet:       Tenderness right foot dorsally over metatarsals as noted on diagram.  Distal Neurovascular function is intact.  No tenderness over base of 5th metatarsal. No ankle tenderness or swelling.    Neurological: She is alert and oriented to person, place, and time.  Skin: Skin is warm and dry.    ED Course  Procedures  none   Dg Foot Complete Right  01/28/2012  *RADIOLOGY REPORT*  Clinical Data: Pain after fall.  RIGHT FOOT COMPLETE - 3+ VIEW  Comparison: None.  Findings: Comminuted fracture of the distal fifth metatarsal is noted.  This is in the region of the metatarsal neck.  No other acute bony abnormality is evident.  Hallux valgus deformity with degeneration is noted at the MTP joint of the great toe.  IMPRESSION: Comminuted fracture of the fifth metatarsal neck.  Original Report Authenticated By: ERIC A. MANSELL, M.D.     1. Fracture of 5th  metatarsal, right, closed, initial encounter       MDM  Ace wrap applied.  Dispensed crutches. Elevate foot.  Wear ace wrap.  Apply ice pack for 30 to 45 minutes every 1 to 4 hours.  Continue until swelling decreases.  Use crutches.  May take Tylenol at bedtime for pain. Followup with [facility] Medicine Clinic As soon as possible.          Lattie Haw, MD 01/29/12 1226

## 2012-01-28 NOTE — ED Notes (Signed)
Twisted right foot today now having pain in toes which radiates up leg

## 2012-01-28 NOTE — Discharge Instructions (Signed)
Elevate foot.  Wear ace wrap.  Apply ice pack for 30 to 45 minutes every 1 to 4 hours.  Continue until swelling decreases.  Use crutches.  May take Tylenol at bedtime for pain.

## 2012-01-29 ENCOUNTER — Telehealth: Payer: Self-pay | Admitting: *Deleted

## 2012-04-09 ENCOUNTER — Ambulatory Visit: Payer: Medicare Other | Attending: Orthopedic Surgery | Admitting: Physical Therapy

## 2012-04-09 DIAGNOSIS — IMO0001 Reserved for inherently not codable concepts without codable children: Secondary | ICD-10-CM | POA: Insufficient documentation

## 2012-04-09 DIAGNOSIS — M6281 Muscle weakness (generalized): Secondary | ICD-10-CM | POA: Insufficient documentation

## 2012-04-09 DIAGNOSIS — M25579 Pain in unspecified ankle and joints of unspecified foot: Secondary | ICD-10-CM | POA: Insufficient documentation

## 2012-04-09 DIAGNOSIS — M2560 Stiffness of unspecified joint, not elsewhere classified: Secondary | ICD-10-CM | POA: Insufficient documentation

## 2012-04-09 DIAGNOSIS — R269 Unspecified abnormalities of gait and mobility: Secondary | ICD-10-CM | POA: Insufficient documentation

## 2012-04-14 ENCOUNTER — Ambulatory Visit: Payer: Medicare Other | Admitting: Physical Therapy

## 2012-04-16 ENCOUNTER — Ambulatory Visit: Payer: Medicare Other | Admitting: Physical Therapy

## 2012-04-18 ENCOUNTER — Ambulatory Visit: Payer: Medicare Other | Admitting: Physical Therapy

## 2012-04-21 ENCOUNTER — Ambulatory Visit: Payer: Medicare Other | Admitting: Physical Therapy

## 2012-04-23 ENCOUNTER — Ambulatory Visit: Payer: Medicare Other | Admitting: Physical Therapy

## 2012-04-25 ENCOUNTER — Ambulatory Visit: Payer: Medicare Other | Admitting: Physical Therapy

## 2012-04-29 ENCOUNTER — Ambulatory Visit: Payer: Medicare Other | Attending: Orthopedic Surgery | Admitting: Physical Therapy

## 2012-04-29 DIAGNOSIS — M25579 Pain in unspecified ankle and joints of unspecified foot: Secondary | ICD-10-CM | POA: Insufficient documentation

## 2012-04-29 DIAGNOSIS — IMO0001 Reserved for inherently not codable concepts without codable children: Secondary | ICD-10-CM | POA: Insufficient documentation

## 2012-04-29 DIAGNOSIS — R269 Unspecified abnormalities of gait and mobility: Secondary | ICD-10-CM | POA: Insufficient documentation

## 2012-04-29 DIAGNOSIS — M6281 Muscle weakness (generalized): Secondary | ICD-10-CM | POA: Insufficient documentation

## 2012-04-29 DIAGNOSIS — M2560 Stiffness of unspecified joint, not elsewhere classified: Secondary | ICD-10-CM | POA: Insufficient documentation

## 2012-05-02 ENCOUNTER — Ambulatory Visit: Payer: Medicare Other | Admitting: Physical Therapy

## 2012-05-09 ENCOUNTER — Ambulatory Visit: Payer: Medicare Other | Admitting: Physical Therapy

## 2012-05-12 ENCOUNTER — Ambulatory Visit: Payer: Medicare Other | Admitting: Physical Therapy

## 2012-05-15 ENCOUNTER — Ambulatory Visit: Payer: Medicare Other | Admitting: Physical Therapy

## 2012-05-19 ENCOUNTER — Ambulatory Visit: Payer: Medicare Other | Admitting: Physical Therapy

## 2012-05-22 ENCOUNTER — Encounter: Payer: Medicare Other | Admitting: Physical Therapy

## 2012-05-26 ENCOUNTER — Ambulatory Visit: Payer: Medicare Other | Admitting: Physical Therapy

## 2012-05-29 ENCOUNTER — Ambulatory Visit: Payer: Medicare Other | Attending: Orthopedic Surgery | Admitting: Physical Therapy

## 2012-05-29 DIAGNOSIS — M2560 Stiffness of unspecified joint, not elsewhere classified: Secondary | ICD-10-CM | POA: Insufficient documentation

## 2012-05-29 DIAGNOSIS — M6281 Muscle weakness (generalized): Secondary | ICD-10-CM | POA: Insufficient documentation

## 2012-05-29 DIAGNOSIS — R269 Unspecified abnormalities of gait and mobility: Secondary | ICD-10-CM | POA: Insufficient documentation

## 2012-05-29 DIAGNOSIS — M25579 Pain in unspecified ankle and joints of unspecified foot: Secondary | ICD-10-CM | POA: Insufficient documentation

## 2012-05-29 DIAGNOSIS — IMO0001 Reserved for inherently not codable concepts without codable children: Secondary | ICD-10-CM | POA: Insufficient documentation

## 2012-06-11 ENCOUNTER — Ambulatory Visit: Payer: Medicare Other | Admitting: Physical Therapy

## 2012-06-18 ENCOUNTER — Ambulatory Visit: Payer: Medicare Other | Admitting: Physical Therapy

## 2012-06-20 ENCOUNTER — Ambulatory Visit: Payer: Medicare Other | Admitting: Physical Therapy

## 2012-06-24 ENCOUNTER — Ambulatory Visit: Payer: Medicare Other | Admitting: Physical Therapy

## 2012-06-25 ENCOUNTER — Encounter: Payer: Self-pay | Admitting: Cardiology

## 2012-06-25 ENCOUNTER — Ambulatory Visit (INDEPENDENT_AMBULATORY_CARE_PROVIDER_SITE_OTHER): Payer: Medicare Other | Admitting: Cardiology

## 2012-06-25 VITALS — BP 144/97 | HR 100 | Ht 61.0 in | Wt 144.0 lb

## 2012-06-25 DIAGNOSIS — R55 Syncope and collapse: Secondary | ICD-10-CM

## 2012-06-25 DIAGNOSIS — F411 Generalized anxiety disorder: Secondary | ICD-10-CM

## 2012-06-25 DIAGNOSIS — I10 Essential (primary) hypertension: Secondary | ICD-10-CM

## 2012-06-25 MED ORDER — ENALAPRIL MALEATE 10 MG PO TABS: 10.0000 mg | ORAL_TABLET | Freq: Two times a day (BID) | ORAL | Status: DC

## 2012-06-25 NOTE — Progress Notes (Signed)
  Pleasant female I saw in Sept 2011 for evaluation of syncope. Seen in the [facility] in August 2011 with near syncope. Cardiac markers were negative. Renal function was normal. Hemoglobin was 14.4. Head CT showed no acute disease. Chest x-ray was normal. Stress echocardiogram in September of 2011 was normal. Patient has had difficulties with elevated blood pressures recently and we were asked to evaluate. She has mild dyspnea on exertion but no orthopnea, PND, pedal edema, syncope or chest pain. She occasionally has brief palpitations. She states her blood pressure is typically in the 150-160 range increases further with anxiety.   Current Outpatient Prescriptions  Medication Sig Dispense Refill  . albuterol (PROAIR HFA) 108 (90 BASE) MCG/ACT inhaler Inhale 2 puffs into the lungs as needed.        . enalapril (VASOTEC) 10 MG tablet Take 5 mg by mouth 2 (two) times daily.       . fexofenadine (ALLEGRA ALLERGY) 180 MG tablet Take 180 mg by mouth daily.        . Fluticasone-Salmeterol (ADVAIR DISKUS) 250-50 MCG/DOSE AEPB Inhale 1 puff into the lungs 2 (two) times daily.        Marland Kitchen HYDROCHLOROTHIAZIDE PO Take by mouth as needed.      . Levothyroxine Sodium 50 MCG CAPS Take by mouth daily.        . NON FORMULARY Estrace .1 mg/gm crea: two times a day       . polyethylene glycol powder (MIRALAX) powder Take 17 g by mouth daily.           Past Medical History  Diagnosis Date  . HTN (hypertension)   . HLD (hyperlipidemia)   . IBS (irritable bowel syndrome)   . Hypothyroid   . Anxiety   . Diverticulosis   . Asthma   . Ductal carcinoma     in situ of right breast  . Hives     chronic    Past Surgical History  Procedure Date  . Hysterectomy (other)   . Right luimpectomy 2005  . Thyroidectomy, partial   . Appendectomy   . Cholecystectomy   . Tonsillectomy     History   Social History  . Marital Status: Married    Spouse Name: N/A    Number of Children: N/A  . Years  of Education: N/A   Occupational History  . Not on file.   Social History Main Topics  . Smoking status: Never Smoker   . Smokeless tobacco: Not on file  . Alcohol Use: No  . Drug Use: No  . Sexually Active:    Other Topics Concern  . Not on file   Social History Narrative  . No narrative on file    ROS: no fevers or chills, productive cough, hemoptysis, dysphasia, odynophagia, melena, hematochezia, dysuria, hematuria, rash, seizure activity, orthopnea, PND, pedal edema, claudication. Remaining systems are negative.  Physical Exam: Well-developed well-nourished in no acute distress.  Skin is warm and dry.  HEENT is normal.  Neck is supple.  Chest is clear to auscultation with normal expansion.  Cardiovascular exam is regular rate and rhythm.  Abdominal exam nontender or distended. No masses palpated. Extremities show no edema. neuro grossly intact  ECG sinus rhythm at a rate of 100. No ST changes.

## 2012-06-25 NOTE — Assessment & Plan Note (Signed)
No further episodes

## 2012-06-25 NOTE — Assessment & Plan Note (Signed)
Management per primary care. 

## 2012-06-25 NOTE — Assessment & Plan Note (Signed)
Patient's blood pressure is mildly elevated and is also elevated at home. I will increase her enalapril to 10 mg by mouth twice a day and continue her diuretic. Check potassium and renal function in one week. She will continue to monitor her blood pressure at home and we will increase medications or add additional medications as needed. I considered a beta blocker given her history of palpitations but she would like to avoid this as she is concerned it may exacerbate her asthma.

## 2012-06-25 NOTE — Patient Instructions (Addendum)
Your physician recommends that you schedule a follow-up appointment in: 8 WEEKS WITH DR CRENSHAW IN [place]  INCREASE ENALAPRIL TO 10 MG TWICE DAILY  Your physician recommends that you return for lab work in: ONE WEEK IN [employer]

## 2012-06-26 ENCOUNTER — Ambulatory Visit: Payer: Medicare Other | Admitting: Physical Therapy

## 2012-07-01 ENCOUNTER — Encounter: Payer: Medicare Other | Admitting: Physical Therapy

## 2012-07-02 ENCOUNTER — Ambulatory Visit: Payer: Medicare Other | Attending: Orthopedic Surgery | Admitting: Physical Therapy

## 2012-07-02 DIAGNOSIS — M2560 Stiffness of unspecified joint, not elsewhere classified: Secondary | ICD-10-CM | POA: Insufficient documentation

## 2012-07-02 DIAGNOSIS — M6281 Muscle weakness (generalized): Secondary | ICD-10-CM | POA: Insufficient documentation

## 2012-07-02 DIAGNOSIS — R269 Unspecified abnormalities of gait and mobility: Secondary | ICD-10-CM | POA: Insufficient documentation

## 2012-07-02 DIAGNOSIS — M25579 Pain in unspecified ankle and joints of unspecified foot: Secondary | ICD-10-CM | POA: Insufficient documentation

## 2012-07-02 DIAGNOSIS — IMO0001 Reserved for inherently not codable concepts without codable children: Secondary | ICD-10-CM | POA: Insufficient documentation

## 2012-07-04 ENCOUNTER — Ambulatory Visit: Payer: Medicare Other | Admitting: Physical Therapy

## 2012-07-08 ENCOUNTER — Ambulatory Visit: Payer: Medicare Other | Admitting: Physical Therapy

## 2012-07-10 ENCOUNTER — Ambulatory Visit: Payer: Medicare Other | Admitting: Physical Therapy

## 2012-07-12 LAB — BASIC METABOLIC PANEL WITH GFR
Calcium: 9.2 mg/dL (ref 8.4–10.5)
Creat: 0.9 mg/dL (ref 0.50–1.10)
GFR, Est African American: 76 mL/min
Glucose, Bld: 97 mg/dL (ref 70–99)
Sodium: 141 mEq/L (ref 135–145)

## 2012-07-22 ENCOUNTER — Telehealth: Payer: Self-pay | Admitting: Cardiology

## 2012-07-22 DIAGNOSIS — I10 Essential (primary) hypertension: Secondary | ICD-10-CM

## 2012-07-22 MED ORDER — ENALAPRIL MALEATE 10 MG PO TABS: ORAL_TABLET | ORAL | Status: DC

## 2012-07-22 NOTE — Telephone Encounter (Signed)
Spoke with pt, at the last office visit her bp was elevated and we increased enalapril to 10 mg bid. She has had a couple episodes of dizziness and her bp has been 106/65, 96/58, 124/81 and 95/58. She has cut the enalapril back to once daily and now her bp is creeping up to 140-150's on top. She would like to stay on the enalapril and wonders if she can take 15 mg instead of 20 in a day. Will forward for dr Jens Som review

## 2012-07-22 NOTE — Telephone Encounter (Signed)
Spoke with pt, Aware of dr crenshaw's recommendations.  °

## 2012-07-22 NOTE — Telephone Encounter (Signed)
PT CALLING RE MEDICATION AND BP

## 2012-07-22 NOTE — Telephone Encounter (Signed)
Take 10 mg enalapril q AM and 5 mg q pm and follow BP Olga Millers

## 2012-09-03 ENCOUNTER — Encounter: Payer: Self-pay | Admitting: Cardiology

## 2012-09-03 ENCOUNTER — Ambulatory Visit (INDEPENDENT_AMBULATORY_CARE_PROVIDER_SITE_OTHER): Payer: Medicare Other | Admitting: Cardiology

## 2012-09-03 VITALS — BP 130/90 | HR 110 | Wt 141.0 lb

## 2012-09-03 DIAGNOSIS — I10 Essential (primary) hypertension: Secondary | ICD-10-CM

## 2012-09-03 DIAGNOSIS — F411 Generalized anxiety disorder: Secondary | ICD-10-CM

## 2012-09-03 DIAGNOSIS — R55 Syncope and collapse: Secondary | ICD-10-CM

## 2012-09-03 NOTE — Patient Instructions (Addendum)
Your physician wants you to follow-up in: ONE YEAR WITH DR CRENSHAW IN [place] You will receive a reminder letter in the mail two months in advance. If you don't receive a letter, please call our office to schedule the follow-up appointment.  

## 2012-09-03 NOTE — Assessment & Plan Note (Signed)
Plan to increase enalapril to 10 mg by mouth twice a day. There appears to be a significant anxiety component. Her blood pressure is typically controlled unless she is having stress or anxiety. She will followup with her primary care physician for further management of anxiety. Continue enalapril 10 mg by mouth twice a day. Follow blood pressure and adjust as needed.

## 2012-09-03 NOTE — Progress Notes (Signed)
HPI: Pleasant female for fu of HTN. Seen in the [facility] in August 2011 with near syncope. Cardiac markers were negative. Renal function was normal. Hemoglobin was 14.4. Head CT showed no acute disease. Chest x-ray was normal. Stress echocardiogram in September of 2011 was normal. Patient has had difficulties with elevated blood pressures recently. When I last saw her we increased her enalapril to 10 mg by mouth twice a day. She had some dizziness and this was changed to 10 mg in the morning and 5 in the evening. Since that time, she has mild dyspnea on exertion but attributes this to deconditioning. There is no orthopnea, PND or pedal edema. She has some chest heaviness with anxiety attacks but no chest pain with exertion. She states her blood pressure is increased at times but typically during anxiety attacks.  Current Outpatient Prescriptions  Medication Sig Dispense Refill  . albuterol (PROAIR HFA) 108 (90 BASE) MCG/ACT inhaler Inhale 2 puffs into the lungs as needed.        . enalapril (VASOTEC) 10 MG tablet Tale one tablet in the am and 1/2 tablet in the pm  60 tablet  12  . fexofenadine (ALLEGRA ALLERGY) 180 MG tablet Take 180 mg by mouth daily.        . Fluticasone-Salmeterol (ADVAIR DISKUS) 250-50 MCG/DOSE AEPB Inhale 1 puff into the lungs 2 (two) times daily.        . Levothyroxine Sodium 50 MCG CAPS Take by mouth daily.        . NON FORMULARY Estrace .1 mg/gm crea: two times a day       . NON FORMULARY Vit b12, b complex injection  Once every other week      . polyethylene glycol powder (MIRALAX) powder Take 17 g by mouth daily.           Past Medical History  Diagnosis Date  . HTN (hypertension)   . HLD (hyperlipidemia)   . IBS (irritable bowel syndrome)   . Hypothyroid   . Anxiety   . Diverticulosis   . Asthma   . Ductal carcinoma     in situ of right breast  . Hives     chronic    Past Surgical History  Procedure Date  . Hysterectomy (other)   .  Right luimpectomy 2005  . Thyroidectomy, partial   . Appendectomy   . Cholecystectomy   . Tonsillectomy     History   Social History  . Marital Status: Married    Spouse Name: N/A    Number of Children: N/A  . Years of Education: N/A   Occupational History  . Not on file.   Social History Main Topics  . Smoking status: Never Smoker   . Smokeless tobacco: Not on file  . Alcohol Use: No  . Drug Use: No  . Sexually Active:    Other Topics Concern  . Not on file   Social History Narrative  . No narrative on file    ROS: no fevers or chills, productive cough, hemoptysis, dysphasia, odynophagia, melena, hematochezia, dysuria, hematuria, rash, seizure activity, orthopnea, PND, pedal edema, claudication. Remaining systems are negative.  Physical Exam: Well-developed well-nourished anxious in no acute distress.  Skin is warm and dry.  HEENT is normal.  Neck is supple.  Chest is clear to auscultation with normal expansion.  Cardiovascular exam is regular rate and rhythm.  Abdominal exam nontender or distended. No masses palpated. Extremities show no edema. neuro grossly intact

## 2012-09-03 NOTE — Assessment & Plan Note (Signed)
Management per primary care. 

## 2012-09-03 NOTE — Assessment & Plan Note (Signed)
No further episodes

## 2012-12-24 DIAGNOSIS — F410 Panic disorder [episodic paroxysmal anxiety] without agoraphobia: Secondary | ICD-10-CM | POA: Insufficient documentation

## 2012-12-24 DIAGNOSIS — K589 Irritable bowel syndrome without diarrhea: Secondary | ICD-10-CM | POA: Insufficient documentation

## 2012-12-24 DIAGNOSIS — E039 Hypothyroidism, unspecified: Secondary | ICD-10-CM | POA: Insufficient documentation

## 2013-07-15 ENCOUNTER — Other Ambulatory Visit: Payer: Self-pay | Admitting: Cardiology

## 2013-09-09 ENCOUNTER — Ambulatory Visit: Payer: Self-pay | Admitting: Podiatrist

## 2013-09-24 ENCOUNTER — Ambulatory Visit: Payer: Self-pay | Admitting: Podiatrist

## 2013-10-01 ENCOUNTER — Ambulatory Visit (INDEPENDENT_AMBULATORY_CARE_PROVIDER_SITE_OTHER): Payer: Medicare Other | Admitting: Podiatrist

## 2013-10-01 ENCOUNTER — Encounter: Payer: Self-pay | Admitting: Podiatrist

## 2013-10-01 VITALS — BP 137/90 | HR 88 | Resp 12

## 2013-10-01 DIAGNOSIS — B351 Tinea unguium: Secondary | ICD-10-CM

## 2013-10-01 DIAGNOSIS — M79609 Pain in unspecified limb: Secondary | ICD-10-CM

## 2013-10-01 NOTE — Progress Notes (Signed)
HPI:  Patient presents today for follow up of foot and nail care. Denies any new complaints today.  Objective:  Patients chart is reviewed.  Vascular status reveals pedal pulses noted at 2 out of 4 dp and pt bilateral .  Neurological sensation is Normal to Sem[employer] monofilament bilateral.  Patients nails are thickened, discolored, distrophic, friable and brittle with yellow-brown discoloration. Patient subjectively relates they are painful with shoes and with ambulation of bilateral feet.  Assessment:  Symptomatic onychomycosis  Plan:  Discussed treatment options and alternatives.  The symptomatic toenails were debrided through manual an mechanical means without complication.  Return appointment recommended at routine intervals of 3 months    Glendel Jaggers, DPM   

## 2013-10-20 ENCOUNTER — Other Ambulatory Visit: Payer: Self-pay | Admitting: Cardiology

## 2013-11-04 ENCOUNTER — Other Ambulatory Visit: Payer: Self-pay | Admitting: *Deleted

## 2013-11-04 MED ORDER — ENALAPRIL MALEATE 10 MG PO TABS: 10.0000 mg | ORAL_TABLET | Freq: Two times a day (BID) | ORAL | Status: DC

## 2013-12-09 ENCOUNTER — Encounter: Payer: Self-pay | Admitting: Cardiology

## 2013-12-09 ENCOUNTER — Ambulatory Visit (INDEPENDENT_AMBULATORY_CARE_PROVIDER_SITE_OTHER): Payer: Medicare Other | Admitting: Cardiology

## 2013-12-09 VITALS — BP 124/80 | HR 88 | Ht 64.0 in | Wt 139.0 lb

## 2013-12-09 DIAGNOSIS — I10 Essential (primary) hypertension: Secondary | ICD-10-CM

## 2013-12-09 DIAGNOSIS — R002 Palpitations: Secondary | ICD-10-CM

## 2013-12-09 DIAGNOSIS — R55 Syncope and collapse: Secondary | ICD-10-CM

## 2013-12-09 NOTE — Patient Instructions (Signed)
Your physician wants you to follow-up in: ONE YEAR WITH DR CRENSHAW You will receive a reminder letter in the mail two months in advance. If you don't receive a letter, please call our office to schedule the follow-up appointment.  

## 2013-12-09 NOTE — Progress Notes (Signed)
      HPI: FU HTN. Stress echocardiogram in September of 2011 was normal. Last seen in Jan 2014. Since then, the patient has dyspnea with more extreme activities but not with routine activities. It is relieved with rest. It is not associated with chest pain. There is no orthopnea, PND or pedal edema. There is no syncope or palpitations. There is no exertional chest pain.    Current Outpatient Prescriptions  Medication Sig Dispense Refill  . albuterol (PROAIR HFA) 108 (90 BASE) MCG/ACT inhaler Inhale 2 puffs into the lungs as needed.        . enalapril (VASOTEC) 10 MG tablet Take 1 tablet (10 mg total) by mouth 2 (two) times daily.  60 tablet  1  . fexofenadine (ALLEGRA ALLERGY) 180 MG tablet Take 180 mg by mouth daily.        . Fluticasone-Salmeterol (ADVAIR DISKUS) 250-50 MCG/DOSE AEPB Inhale 1 puff into the lungs 2 (two) times daily.        . Levothyroxine Sodium 50 MCG CAPS Take by mouth daily.        . NON FORMULARY Estrace .1 mg/gm crea: two times a day       . NON FORMULARY Vit b12, b complex injection  Once every other week      . polyethylene glycol powder (MIRALAX) powder Take 17 g by mouth daily.         No current facility-administered medications for this visit.     Past Medical History  Diagnosis Date  . HTN (hypertension)   . HLD (hyperlipidemia)   . IBS (irritable bowel syndrome)   . Hypothyroid   . Anxiety   . Diverticulosis   . Asthma   . Ductal carcinoma     in situ of right breast  . Hives     chronic    Past Surgical History  Procedure Laterality Date  . Hysterectomy (other)    . Right luimpectomy  2005  . Thyroidectomy, partial    . Appendectomy    . Cholecystectomy    . Tonsillectomy      History   Social History  . Marital Status: Married    Spouse Name: N/A    Number of Children: N/A  . Years of Education: N/A   Occupational History  . Not on file.   Social History Main Topics  . Smoking status: Never Smoker   . Smokeless tobacco:  Not on file  . Alcohol Use: No  . Drug Use: No  . Sexual Activity:    Other Topics Concern  . Not on file   Social History Narrative  . No narrative on file    ROS: no fevers or chills, productive cough, hemoptysis, dysphasia, odynophagia, melena, hematochezia, dysuria, hematuria, rash, seizure activity, orthopnea, PND, pedal edema, claudication. Remaining systems are negative.  Physical Exam: Well-developed well-nourished in no acute distress.  Skin is warm and dry.  HEENT is normal.  Neck is supple.  Chest is clear to auscultation with normal expansion.  Cardiovascular exam is regular rate and rhythm.  Abdominal exam nontender or distended. No masses palpated. Extremities show no edema. neuro grossly intact  ECG sinus rhythm with no ST changes.

## 2013-12-09 NOTE — Assessment & Plan Note (Signed)
No recurrent episodes. 

## 2013-12-09 NOTE — Assessment & Plan Note (Signed)
Blood pressure controlled. Continue present medications. For potassium and renal function monitored by primary care.

## 2014-01-28 ENCOUNTER — Ambulatory Visit: Payer: Medicare Other | Admitting: Podiatrist

## 2014-01-31 ENCOUNTER — Other Ambulatory Visit: Payer: Self-pay | Admitting: Cardiology

## 2014-02-04 ENCOUNTER — Ambulatory Visit (INDEPENDENT_AMBULATORY_CARE_PROVIDER_SITE_OTHER): Payer: Medicare Other | Admitting: Podiatrist

## 2014-02-04 DIAGNOSIS — M79609 Pain in unspecified limb: Secondary | ICD-10-CM

## 2014-02-04 DIAGNOSIS — B351 Tinea unguium: Secondary | ICD-10-CM

## 2014-02-12 NOTE — Progress Notes (Signed)
HPI:  Patient presents today for follow up of foot and nail care. Denies any new complaints today.  Objective:  Patients chart is reviewed.  Vascular status reveals pedal pulses noted at 2 out of 4 dp and pt bilateral .  Neurological sensation is Normal to Semmes Weinstein monofilament bilateral.  Patients nails are thickened, discolored, distrophic, friable and brittle with yellow-brown discoloration. Patient subjectively relates they are painful with shoes and with ambulation of bilateral feet.  Assessment:  Symptomatic onychomycosis  Plan:  Discussed treatment options and alternatives.  The symptomatic toenails were debrided through manual an mechanical means without complication.  Return appointment recommended at routine intervals of 3 months    Kathryn Egerton, DPM   

## 2014-03-22 ENCOUNTER — Other Ambulatory Visit: Payer: Self-pay

## 2014-03-22 MED ORDER — ENALAPRIL MALEATE 10 MG PO TABS: ORAL_TABLET | ORAL | Status: DC

## 2014-05-20 ENCOUNTER — Ambulatory Visit: Payer: Medicare Other | Admitting: Podiatrist

## 2014-05-27 ENCOUNTER — Ambulatory Visit (INDEPENDENT_AMBULATORY_CARE_PROVIDER_SITE_OTHER): Payer: Medicare Other | Admitting: Podiatrist

## 2014-05-27 DIAGNOSIS — B351 Tinea unguium: Secondary | ICD-10-CM

## 2014-05-27 DIAGNOSIS — M79676 Pain in unspecified toe(s): Secondary | ICD-10-CM

## 2014-05-27 NOTE — Progress Notes (Signed)
HPI:  Patient presents today for follow up of foot and nail care. Denies any new complaints today.  Objective:  Patients chart is reviewed.  Vascular status reveals pedal pulses noted at 2 out of 4 dp and pt bilateral .  Neurological sensation is Normal to Semmes Weinstein monofilament bilateral.  Patients nails are thickened, discolored, distrophic, friable and brittle with yellow-brown discoloration. Patient subjectively relates they are painful with shoes and with ambulation of bilateral feet.  Assessment:  Symptomatic onychomycosis  Plan:  Discussed treatment options and alternatives.  The symptomatic toenails were debrided through manual an mechanical means without complication.  Return appointment recommended at routine intervals of 3 months    Clanton Emanuelson, DPM   

## 2014-08-03 ENCOUNTER — Other Ambulatory Visit: Payer: Self-pay | Admitting: *Deleted

## 2014-08-03 MED ORDER — ENALAPRIL MALEATE 10 MG PO TABS: ORAL_TABLET | ORAL | Status: DC

## 2014-09-09 ENCOUNTER — Encounter: Payer: Self-pay | Admitting: Podiatrist

## 2014-09-09 ENCOUNTER — Ambulatory Visit (INDEPENDENT_AMBULATORY_CARE_PROVIDER_SITE_OTHER): Payer: Medicare Other | Admitting: Podiatrist

## 2014-09-09 DIAGNOSIS — M79676 Pain in unspecified toe(s): Secondary | ICD-10-CM

## 2014-09-09 DIAGNOSIS — B351 Tinea unguium: Secondary | ICD-10-CM

## 2014-09-09 NOTE — Progress Notes (Signed)
HPI: Patient presents today for follow up of foot and nail care. Denies any new complaints today.  Objective: Patients chart is reviewed. Vascular status reveals pedal pulses noted at 2 out of 4 dp and pt bilateral . Neurological sensation is Normal to [employer] monofilament bilateral. Patients nails 1,2,3,4,5 bilateral are thickened, discolored, distrophic, friable and brittle with yellow-brown discoloration. Patient subjectively relates they are painful with shoes and with ambulation of bilateral feet.  Assessment: Symptomatic onychomycosis  Plan: Discussed treatment options and alternatives. The symptomatic toenails were debrided through manual an mechanical means without complication. Return appointment recommended at routine intervals of 3 months

## 2014-11-16 ENCOUNTER — Other Ambulatory Visit: Payer: Self-pay

## 2014-11-16 MED ORDER — ENALAPRIL MALEATE 10 MG PO TABS: ORAL_TABLET | ORAL | Status: DC

## 2014-12-15 ENCOUNTER — Telehealth: Payer: Self-pay | Admitting: Podiatrist

## 2014-12-15 NOTE — Telephone Encounter (Signed)
Pt very concerned about you leaving for the summer and was asking if you could recommend another doctor in the [place] area preferably a female. She is not able to make appts in the afternoon at [place] office due to husband starting a new job.

## 2014-12-15 NOTE — Telephone Encounter (Signed)
Sure-- I think she would really like Dr. Allean Found of [facility] [place].  [phone]

## 2014-12-16 ENCOUNTER — Ambulatory Visit: Payer: Medicare Other

## 2015-04-01 ENCOUNTER — Other Ambulatory Visit: Payer: Self-pay | Admitting: *Deleted

## 2015-04-01 MED ORDER — ENALAPRIL MALEATE 10 MG PO TABS: ORAL_TABLET | ORAL | Status: DC

## 2015-05-17 ENCOUNTER — Other Ambulatory Visit: Payer: Self-pay | Admitting: *Deleted

## 2015-05-17 MED ORDER — ENALAPRIL MALEATE 10 MG PO TABS: ORAL_TABLET | ORAL | Status: DC

## 2015-06-06 NOTE — Progress Notes (Signed)
      HPI: FU HTN. Stress echocardiogram in September of 2011 was normal. Since last seen,   Current Outpatient Prescriptions  Medication Sig Dispense Refill  . albuterol (PROAIR HFA) 108 (90 BASE) MCG/ACT inhaler Inhale 2 puffs into the lungs as needed.      . enalapril (VASOTEC) 10 MG tablet TAKE ONE TABLET BY MOUTH TWICE DAILY 60 tablet 0  . fexofenadine (ALLEGRA ALLERGY) 180 MG tablet Take 180 mg by mouth daily.      . Fluticasone-Salmeterol (ADVAIR DISKUS) 250-50 MCG/DOSE AEPB Inhale 1 puff into the lungs 2 (two) times daily.      . Levothyroxine Sodium 50 MCG CAPS Take by mouth daily.      . NON FORMULARY Estrace .1 mg/gm crea: two times a day     . NON FORMULARY Vit b12, b complex injection  Once every other week    . polyethylene glycol powder (MIRALAX) powder Take 17 g by mouth daily.       No current facility-administered medications for this visit.     Past Medical History  Diagnosis Date  . HTN (hypertension)   . HLD (hyperlipidemia)   . IBS (irritable bowel syndrome)   . Hypothyroid   . Anxiety   . Diverticulosis   . Asthma   . Ductal carcinoma (HCC)     in situ of right breast  . Hives     chronic    Past Surgical History  Procedure Laterality Date  . Hysterectomy (other)    . Right luimpectomy  2005  . Thyroidectomy, partial    . Appendectomy    . Cholecystectomy    . Tonsillectomy      Social History   Social History  . Marital Status: Married    Spouse Name: N/A  . Number of Children: N/A  . Years of Education: N/A   Occupational History  . Not on file.   Social History Main Topics  . Smoking status: Never Smoker   . Smokeless tobacco: Not on file  . Alcohol Use: No  . Drug Use: No  . Sexual Activity: Not on file   Other Topics Concern  . Not on file   Social History Narrative    ROS: no fevers or chills, productive cough, hemoptysis, dysphasia, odynophagia, melena, hematochezia, dysuria, hematuria, rash, seizure activity,  orthopnea, PND, pedal edema, claudication. Remaining systems are negative.  Physical Exam: Well-developed well-nourished in no acute distress.  Skin is warm and dry.  HEENT is normal.  Neck is supple.  Chest is clear to auscultation with normal expansion.  Cardiovascular exam is regular rate and rhythm.  Abdominal exam nontender or distended. No masses palpated. Extremities show no edema. neuro grossly intact  ECG     This encounter was created in error - please disregard.

## 2015-06-08 ENCOUNTER — Encounter: Payer: Self-pay | Admitting: Cardiology

## 2015-06-22 ENCOUNTER — Other Ambulatory Visit: Payer: Self-pay

## 2015-06-22 ENCOUNTER — Telehealth: Payer: Self-pay | Admitting: Cardiology

## 2015-06-22 ENCOUNTER — Telehealth: Payer: Self-pay | Admitting: *Deleted

## 2015-06-22 MED ORDER — ENALAPRIL MALEATE 10 MG PO TABS: ORAL_TABLET | ORAL | Status: DC

## 2015-06-22 NOTE — Telephone Encounter (Signed)
°  STAT if patient is at the pharmacy , call can be transferred to refill team.   1. Which medications need to be refilled? Enalapril 10mg   2. Which pharmacy/location is medication to be sent to?[facility] [phone]  3. Do they need a 30 day or 90 day supply? 60 day supply

## 2015-06-22 NOTE — Telephone Encounter (Signed)
Pharmacy called and stated that they have sent multiple faxes to the office for refills for this patient. She has not been keeping her appointments, and now has one scheduled for December. Please advise. Thanks, MI

## 2015-06-23 ENCOUNTER — Encounter: Payer: Self-pay | Admitting: *Deleted

## 2015-06-23 ENCOUNTER — Other Ambulatory Visit: Payer: Self-pay | Admitting: *Deleted

## 2015-06-23 NOTE — Telephone Encounter (Signed)
Refill has been sent electronically

## 2015-06-23 NOTE — Telephone Encounter (Signed)
Left message for pt to call.

## 2015-06-23 NOTE — Telephone Encounter (Signed)
Spoke with pt, aware refill for enalapril is at the pharmacy. Patient voiced understanding of appt location and time. She is aware to keep the appt to get further refills.

## 2015-07-19 ENCOUNTER — Other Ambulatory Visit: Payer: Self-pay

## 2015-07-19 MED ORDER — ENALAPRIL MALEATE 10 MG PO TABS: ORAL_TABLET | ORAL | Status: DC

## 2015-08-01 NOTE — Progress Notes (Signed)
      HPI: FU HTN. Stress echocardiogram in September of 2011 was normal. Since last seen, the patient denies any dyspnea on exertion, orthopnea, PND, pedal edema, syncope or chest pain. She occasionally has a brief skip but no sustained palpitations.   Current Outpatient Prescriptions  Medication Sig Dispense Refill  . albuterol (PROAIR HFA) 108 (90 BASE) MCG/ACT inhaler Inhale 2 puffs into the lungs as needed.      . clonazePAM (KLONOPIN) 1 MG tablet Take 1 mg by mouth at bedtime.     . cyanocobalamin (,VITAMIN B-12,) 1000 MCG/ML injection Inject as directed. Once per week or every other week. Per Dr Sharol Roussel    . enalapril (VASOTEC) 10 MG tablet TAKE ONE TABLET BY MOUTH TWICE DAILY 60 tablet 0  . estradiol (ESTRACE VAGINAL) 0.1 MG/GM vaginal cream Apply 1 application topically 2 (two) times daily.    . fexofenadine (ALLEGRA ALLERGY) 180 MG tablet Take 180 mg by mouth daily.      . Glycine POWD Take 1 capsule by mouth as needed (Blood Pressure).     . hydrochlorothiazide (HYDRODIURIL) 12.5 MG tablet Take 12.5 mg by mouth as needed.     . Levothyroxine Sodium 50 MCG CAPS Take by mouth daily.      . polyethylene glycol powder (MIRALAX) powder Take 17 g by mouth daily.       No current facility-administered medications for this visit.     Past Medical History  Diagnosis Date  . HTN (hypertension)   . HLD (hyperlipidemia)   . IBS (irritable bowel syndrome)   . Hypothyroid   . Anxiety   . Diverticulosis   . Asthma   . Ductal carcinoma (HCC)     in situ of right breast  . Hives     chronic    Past Surgical History  Procedure Laterality Date  . Hysterectomy (other)    . Right luimpectomy  2005  . Thyroidectomy, partial    . Appendectomy    . Cholecystectomy    . Tonsillectomy      Social History   Social History  . Marital Status: Married    Spouse Name: N/A  . Number of Children: N/A  . Years of Education: N/A   Occupational History  . Not on file.   Social  History Main Topics  . Smoking status: Never Smoker   . Smokeless tobacco: Not on file  . Alcohol Use: No  . Drug Use: No  . Sexual Activity: Not on file   Other Topics Concern  . Not on file   Social History Narrative    ROS: no fevers or chills, productive cough, hemoptysis, dysphasia, odynophagia, melena, hematochezia, dysuria, hematuria, rash, seizure activity, orthopnea, PND, pedal edema, claudication. Remaining systems are negative.  Physical Exam: Well-developed well-nourished in no acute distress.  Skin is warm and dry.  HEENT is normal.  Neck is supple.  Chest is clear to auscultation with normal expansion.  Cardiovascular exam is regular rate and rhythm.  Abdominal exam nontender or distended. No masses palpated. Extremities show no edema. neuro grossly intact  ECG Normal sinus rhythm with no ST changes.

## 2015-08-03 ENCOUNTER — Encounter: Payer: Self-pay | Admitting: Cardiology

## 2015-08-03 ENCOUNTER — Ambulatory Visit (INDEPENDENT_AMBULATORY_CARE_PROVIDER_SITE_OTHER): Payer: Medicare Other | Admitting: Cardiology

## 2015-08-03 VITALS — BP 134/88 | HR 85 | Ht 64.0 in | Wt 145.4 lb

## 2015-08-03 DIAGNOSIS — R55 Syncope and collapse: Secondary | ICD-10-CM | POA: Diagnosis not present

## 2015-08-03 NOTE — Assessment & Plan Note (Signed)
Management per primary care. 

## 2015-08-03 NOTE — Patient Instructions (Signed)
Dr Stanford Breed has made no changes today in your current medications or treatment plan.  Your physician recommends that you schedule a follow-up appointment in 1 year. You will receive a reminder letter in the mail two months in advance. If you don't receive a letter, please call our office to schedule the follow-up appointment.  If you need a refill on your cardiac medications before your next appointment, please call your pharmacy.

## 2015-08-03 NOTE — Assessment & Plan Note (Signed)
No further episodes

## 2015-08-03 NOTE — Assessment & Plan Note (Signed)
Blood pressure controlled. Continue present medications. 

## 2015-08-30 ENCOUNTER — Other Ambulatory Visit: Payer: Self-pay

## 2015-08-30 MED ORDER — ENALAPRIL MALEATE 10 MG PO TABS: ORAL_TABLET | ORAL | Status: DC

## 2015-08-30 NOTE — Telephone Encounter (Signed)
Lelon Perla, MD at 08/01/2015 1:57 PM  enalapril (VASOTEC) 10 MG tabletTAKE ONE TABLET BY MOUTH TWICE DAIL Patient Instructions     Dr Stanford Breed has made no changes today in your current medications or treatment plan.

## 2015-10-13 ENCOUNTER — Other Ambulatory Visit: Payer: Self-pay | Admitting: *Deleted

## 2015-10-13 MED ORDER — ENALAPRIL MALEATE 10 MG PO TABS: ORAL_TABLET | ORAL | Status: DC

## 2015-10-19 ENCOUNTER — Other Ambulatory Visit: Payer: Self-pay | Admitting: *Deleted

## 2015-10-19 MED ORDER — ENALAPRIL MALEATE 10 MG PO TABS: ORAL_TABLET | ORAL | Status: DC

## 2015-10-20 ENCOUNTER — Telehealth: Payer: Self-pay | Admitting: *Deleted

## 2015-10-20 NOTE — Telephone Encounter (Signed)
Returning patient's call from 10/18/15.

## 2015-10-20 NOTE — Telephone Encounter (Signed)
Mrs. Delany called back and stated her issue is resolved.

## 2016-10-07 ENCOUNTER — Other Ambulatory Visit: Payer: Self-pay | Admitting: Cardiology
